# Patient Record
Sex: Female | Born: 1965 | Race: White | Hispanic: No | Marital: Married | Smoking: Never smoker
Health system: Southern US, Community
[De-identification: ages and names within clinical notes are randomized; demographics above are authoritative.]

## PROBLEM LIST (undated history)

## (undated) DIAGNOSIS — I429 Cardiomyopathy, unspecified: Secondary | ICD-10-CM

## (undated) DIAGNOSIS — F1910 Other psychoactive substance abuse, uncomplicated: Secondary | ICD-10-CM

## (undated) DIAGNOSIS — N289 Disorder of kidney and ureter, unspecified: Secondary | ICD-10-CM

## (undated) DIAGNOSIS — I509 Heart failure, unspecified: Secondary | ICD-10-CM

## (undated) DIAGNOSIS — F419 Anxiety disorder, unspecified: Secondary | ICD-10-CM

## (undated) DIAGNOSIS — I10 Essential (primary) hypertension: Secondary | ICD-10-CM

---

## 2008-03-29 ENCOUNTER — Ambulatory Visit: Payer: Self-pay | Admitting: Family Medicine

## 2011-05-29 DIAGNOSIS — I5032 Chronic diastolic (congestive) heart failure: Secondary | ICD-10-CM | POA: Insufficient documentation

## 2011-05-29 DIAGNOSIS — Z889 Allergy status to unspecified drugs, medicaments and biological substances status: Secondary | ICD-10-CM | POA: Insufficient documentation

## 2011-05-29 DIAGNOSIS — I10 Essential (primary) hypertension: Secondary | ICD-10-CM | POA: Insufficient documentation

## 2011-07-30 ENCOUNTER — Ambulatory Visit: Payer: Self-pay | Admitting: Family Medicine

## 2012-01-17 ENCOUNTER — Ambulatory Visit: Payer: Self-pay | Admitting: Family Medicine

## 2012-01-18 ENCOUNTER — Emergency Department: Payer: Self-pay | Admitting: Emergency Medicine

## 2012-02-16 ENCOUNTER — Ambulatory Visit: Payer: Self-pay | Admitting: Internal Medicine

## 2013-10-12 ENCOUNTER — Ambulatory Visit: Payer: Self-pay | Admitting: Physician Assistant

## 2013-10-12 LAB — BASIC METABOLIC PANEL
ANION GAP: 12 (ref 7–16)
BUN: 13 mg/dL (ref 7–18)
CHLORIDE: 100 mmol/L (ref 98–107)
CO2: 24 mmol/L (ref 21–32)
Calcium, Total: 8.9 mg/dL (ref 8.5–10.1)
Creatinine: 1.22 mg/dL (ref 0.60–1.30)
EGFR (Non-African Amer.): 52 — ABNORMAL LOW
Glucose: 91 mg/dL (ref 65–99)
Osmolality: 272 (ref 275–301)
Potassium: 3.3 mmol/L — ABNORMAL LOW (ref 3.5–5.1)
Sodium: 136 mmol/L (ref 136–145)

## 2013-10-15 LAB — RAPID INFLUENZA A&B ANTIGENS

## 2015-12-02 ENCOUNTER — Emergency Department: Payer: Medicare Other

## 2015-12-02 ENCOUNTER — Emergency Department
Admission: EM | Admit: 2015-12-02 | Discharge: 2015-12-02 | Disposition: A | Discharge status: Home / Self Care | Payer: Medicare Other | Attending: Emergency Medicine | Admitting: Emergency Medicine

## 2015-12-02 ENCOUNTER — Encounter: Payer: Self-pay | Admitting: Emergency Medicine

## 2015-12-02 DIAGNOSIS — Z79899 Other long term (current) drug therapy: Secondary | ICD-10-CM | POA: Insufficient documentation

## 2015-12-02 DIAGNOSIS — R05 Cough: Secondary | ICD-10-CM | POA: Diagnosis present

## 2015-12-02 DIAGNOSIS — J209 Acute bronchitis, unspecified: Secondary | ICD-10-CM | POA: Diagnosis not present

## 2015-12-02 DIAGNOSIS — Z7951 Long term (current) use of inhaled steroids: Secondary | ICD-10-CM | POA: Insufficient documentation

## 2015-12-02 DIAGNOSIS — J40 Bronchitis, not specified as acute or chronic: Secondary | ICD-10-CM

## 2015-12-02 HISTORY — DX: Heart failure, unspecified: I50.9

## 2015-12-02 HISTORY — DX: Disorder of kidney and ureter, unspecified: N28.9

## 2015-12-02 MED ORDER — PREDNISONE 10 MG PO TABS: 10.0000 mg | ORAL_TABLET | ORAL | Status: DC

## 2015-12-02 MED ORDER — AZITHROMYCIN 250 MG PO TABS: ORAL_TABLET | ORAL | Status: DC

## 2015-12-02 MED ORDER — ALBUTEROL SULFATE (2.5 MG/3ML) 0.083% IN NEBU: 2.5000 mg | INHALATION_SOLUTION | Freq: Four times a day (QID) | RESPIRATORY_TRACT | Status: DC | PRN

## 2015-12-02 MED ORDER — PROMETHAZINE HCL 12.5 MG PO TABS: 12.5000 mg | ORAL_TABLET | Freq: Four times a day (QID) | ORAL | Status: DC | PRN

## 2015-12-02 NOTE — Discharge Instructions (Signed)

## 2015-12-02 NOTE — ED Notes (Signed)
Cough and fever x 2 days.  

## 2015-12-02 NOTE — ED Provider Notes (Signed)
[facility] Emergency Department Provider Note  ____________________________________________  Time seen: Approximately 8:53 AM  I have reviewed the triage vital signs and the nursing notes.   HISTORY  Chief Complaint Cough    HPI Katie [name] is a 50 y.o. female who presents emergency department complaining of cough and fever 2 days. Patient states that she has a history of bronchitis and pneumonia sporadically. She states that she used her nebulizer machine with mild relief yesterday but states that she has her wheezing and "rattling" while breathing. Patient denies any difficulty breathing at this time. She denies any chest pain. Patient denies any nasal congestion, sore throat, abdominal pain, nausea or vomiting, diarrhea or constipation.   Past Medical History  Diagnosis Date  . CHF (congestive heart failure) (HCC)   . Renal insufficiency     There are no active problems to display for this patient.   History reviewed. No pertinent past surgical history.  Current Outpatient Rx  Name  Route  Sig  Dispense  Refill  . albuterol (PROVENTIL HFA;VENTOLIN HFA) 108 (90 Base) MCG/ACT inhaler   Inhalation   Inhale 2 puffs into the lungs every 6 (six) hours as needed for wheezing or shortness of breath.         . carvedilol (COREG) 12.5 MG tablet   Oral   Take 12.5 mg by mouth 2 (two) times daily with a meal.         . Cholecalciferol 1000 units tablet   Oral   Take 1,000 Units by mouth daily.         . clorazepate (TRANXENE) 15 MG tablet   Oral   Take 15 mg by mouth 2 (two) times daily.         . fluticasone (FLONASE) 50 MCG/ACT nasal spray   Each Nare   Place 2 sprays into both nostrils daily.         . fluticasone (VERAMYST) 27.5 MCG/SPRAY nasal spray   Nasal   Place 2 sprays into the nose daily.         . furosemide (LASIX) 10 MG/ML solution   Oral   Take 80 mg by mouth daily. Take 2 tabs in am and 1 tab in the  afternoon         . gabapentin (NEURONTIN) 300 MG capsule   Oral   Take 300 mg by mouth 2 (two) times daily.         Marland Kitchen omeprazole (PRILOSEC) 20 MG capsule   Oral   Take 20 mg by mouth daily.         . potassium chloride SA (K-DUR,KLOR-CON) 20 MEQ tablet   Oral   Take 20 mEq by mouth 2 (two) times daily.         . prazosin (MINIPRESS) 2 MG capsule   Oral   Take 2 mg by mouth at bedtime.         . ramipril (ALTACE) 10 MG capsule   Oral   Take 10 mg by mouth daily.         . sertraline (ZOLOFT) 100 MG tablet   Oral   Take 100 mg by mouth daily.         Marland Kitchen zolpidem (AMBIEN) 10 MG tablet   Oral   Take 10 mg by mouth at bedtime as needed for sleep.         Marland Kitchen albuterol (PROVENTIL) (2.5 MG/3ML) 0.083% nebulizer solution   Nebulization   Take 3 mLs (  2.5 mg total) by nebulization every 6 (six) hours as needed for wheezing or shortness of breath.   50 vial   0   . azithromycin (ZITHROMAX Z-PAK) 250 MG tablet      Take 2 tablets (500 mg) on  Day 1,  followed by 1 tablet (250 mg) once daily on Days 2 through 5.   6 each   0   . predniSONE (DELTASONE) 10 MG tablet   Oral   Take 1 tablet (10 mg total) by mouth as directed.   21 tablet   0     Take on a daily basis of 6, 5, 4, 3, 2, 1   . promethazine (PHENERGAN) 12.5 MG tablet   Oral   Take 1 tablet (12.5 mg total) by mouth every 6 (six) hours as needed for nausea or vomiting.   15 tablet   0   . spironolactone (ALDACTONE) 25 MG tablet   Oral   Take 25 mg by mouth daily.           Allergies Ciprofloxacin  No family history on file.  Social History Social History  Substance Use Topics  . Smoking status: Never Smoker   . Smokeless tobacco: None  . Alcohol Use: No     Review of Systems  Constitutional: Positive fever/chills Eyes: No visual changes. No discharge ENT: No sore throat. No nasal congestion. No ear pain. Cardiovascular: no chest pain. Respiratory: Positive cough. No  SOB. Gastrointestinal: No abdominal pain.  No nausea, no vomiting.  No diarrhea.  No constipation. Skin: Negative for rash. Neurological: Negative for headaches, focal weakness or numbness. 10-point ROS otherwise negative.  ____________________________________________   PHYSICAL EXAM:  VITAL SIGNS: ED Triage Vitals  Enc Vitals Group     BP 12/02/15 0810 127/73 mmHg     Pulse Rate 12/02/15 0810 104     Resp 12/02/15 0810 20     Temp 12/02/15 0810 98.4 F (36.9 C)     Temp Source 12/02/15 0810 Oral     SpO2 12/02/15 0810 95 %     Weight 12/02/15 0810 180 lb (81.647 kg)     Height 12/02/15 0810 5\' 2"  (1.575 m)     Head Cir --      Peak Flow --      Pain Score 12/02/15 0811 10     Pain Loc --      Pain Edu? --      Excl. in GC? --      Constitutional: Alert and oriented. Well appearing and in no acute distress. Eyes: Conjunctivae are normal. PERRL. EOMI. Head: Atraumatic. ENT:      Ears: ACs and TMs are unremarkable bilaterally.      Nose: No congestion/rhinnorhea.      Mouth/Throat: Mucous membranes are moist. Oropharynx is nonerythematous and nonedematous. Neck: No stridor.  Full range of motion to neck. Neck is supple. Hematological/Lymphatic/Immunilogical: No cervical lymphadenopathy. Cardiovascular: Normal rate, regular rhythm. Normal S1 and S2.  Good peripheral circulation. Respiratory: Normal respiratory effort without tachypnea or retractions. Lungs with rhonchi and wheezing to right lower lobe. Good air entry into the bases bilaterally. No absent or decreased breath sounds. Gastrointestinal: Soft and nontender. No distention. No CVA tenderness. Musculoskeletal: No lower extremity tenderness nor edema.  No joint effusions. Neurologic:  Normal speech and language. No gross focal neurologic deficits are appreciated.  Skin:  Skin is warm, dry and intact. No rash noted. Psychiatric: Mood and affect are normal. Speech and behavior are  normal. Patient exhibits appropriate  insight and judgement.   ____________________________________________   LABS (all labs ordered are listed, but only abnormal results are displayed)  Labs Reviewed - No data to display ____________________________________________  EKG   ____________________________________________  RADIOLOGY Festus Barren Cuthriell, personally viewed and evaluated these images (plain radiographs) as part of my medical decision making, as well as reviewing the written report by the radiologist.  Dg Chest 2 View  12/02/2015  CLINICAL DATA:  Chest pain with chills and cough for 1 day EXAM: CHEST  2 VIEW COMPARISON:  October 12, 2013 FINDINGS: There is mild left base atelectasis. There is no edema or consolidation. The heart size and pulmonary vascularity are normal. No adenopathy. No bone lesions. IMPRESSION: Mild left base atelectasis.  No edema or consolidation. Electronically Signed   By: Bretta Bang III M.D.   On: 12/02/2015 09:19    ____________________________________________    PROCEDURES  Procedure(s) performed:       Medications - No data to display   ____________________________________________   INITIAL IMPRESSION / ASSESSMENT AND PLAN / ED COURSE  Pertinent labs & imaging results that were available during my care of the patient were reviewed by me and considered in my medical decision making (see chart for details).  Patient's diagnosis is consistent with bronchitis. Patient will be discharged home with prescriptions for azithromycin, steroids, refill of her nebulized albuterol, and Phenergan. Patient is to follow up with primary care provider if symptoms persist past this treatment course. Patient is given ED precautions to return to the ED for any worsening or new symptoms.     ____________________________________________  FINAL CLINICAL IMPRESSION(S) / ED DIAGNOSES  Final diagnoses:  Bronchitis      NEW MEDICATIONS STARTED DURING THIS VISIT:  New  Prescriptions   ALBUTEROL (PROVENTIL) (2.5 MG/3ML) 0.083% NEBULIZER SOLUTION    Take 3 mLs (2.5 mg total) by nebulization every 6 (six) hours as needed for wheezing or shortness of breath.   AZITHROMYCIN (ZITHROMAX Z-PAK) 250 MG TABLET    Take 2 tablets (500 mg) on  Day 1,  followed by 1 tablet (250 mg) once daily on Days 2 through 5.   PREDNISONE (DELTASONE) 10 MG TABLET    Take 1 tablet (10 mg total) by mouth as directed.   PROMETHAZINE (PHENERGAN) 12.5 MG TABLET    Take 1 tablet (12.5 mg total) by mouth every 6 (six) hours as needed for nausea or vomiting.        Delorise Royals Cuthriell, PA-C 12/02/15 7829  Sharman Cheek, MD 12/02/15 [phone]

## 2016-04-02 ENCOUNTER — Ambulatory Visit
Admission: EM | Admit: 2016-04-02 | Discharge: 2016-04-02 | Disposition: A | Discharge status: Home / Self Care | Payer: Medicare Other | Attending: Family Medicine | Admitting: Family Medicine

## 2016-04-02 ENCOUNTER — Encounter: Payer: Self-pay | Admitting: Emergency Medicine

## 2016-04-02 DIAGNOSIS — S0502XA Injury of conjunctiva and corneal abrasion without foreign body, left eye, initial encounter: Secondary | ICD-10-CM

## 2016-04-02 HISTORY — DX: Essential (primary) hypertension: I10

## 2016-04-02 MED ORDER — HYDROCODONE-ACETAMINOPHEN 5-325 MG PO TABS: 1.0000 | ORAL_TABLET | Freq: Three times a day (TID) | ORAL | Status: DC | PRN

## 2016-04-02 MED ORDER — GENTAMICIN SULFATE 0.3 % OP SOLN: 2.0000 [drp] | Freq: Three times a day (TID) | OPHTHALMIC | Status: DC

## 2016-04-02 NOTE — ED Provider Notes (Signed)
CSN: [id number]     Arrival date & time 04/02/16  1246 History   First MD Initiated Contact with Patient 04/02/16 1421     Nurses notes were reviewed. Chief Complaint  Patient presents with  . Eye Drainage   Patient's here because of foreign object sensation in the left eye. She states this started this morning he feels that something is in her left eye. She has 2 dogs is wondering whether one the dorsum of the hairs from the dogs got into her left eye. She states the pain as severe as 10 out of 10. She comes in wearing sunglasses\   Past history marked medical history she has a history of CHF renal insufficiency and hypertension. She states she has cardiomyopathy and asthma she got the CHF. Father also and family has had history of CHF and cardiomyopathy as well. She does not smoke now. She is allergic to ciprofloxacin.     (Consider location/radiation/quality/duration/timing/severity/associated sxs/prior Treatment) Patient is a 50 y.o. female presenting with eye problem. The history is provided by the patient and a relative. No language interpreter was used.  Eye Problem Location:  L eye Quality:  Aching, foreign body sensation and sharp Severity:  Severe Onset quality:  Sudden Duration:  6 hours Timing:  Constant Progression:  Worsening Chronicity:  New Context: foreign body and scratch   Context: not direct trauma   Foreign body:  Unknown Relieved by:  Nothing Worsened by:  Nothing tried Ineffective treatments:  None tried Associated symptoms: discharge, itching and redness     Past Medical History  Diagnosis Date  . CHF (congestive heart failure) (HCC)   . Renal insufficiency   . Hypertension    History reviewed. No pertinent past surgical history. History reviewed. No pertinent family history. Social History  Substance Use Topics  . Smoking status: Never Smoker   . Smokeless tobacco: None  . Alcohol Use: No   OB History    No data available     Review of  Systems  Eyes: Positive for pain, discharge, redness and itching.  All other systems reviewed and are negative.   Allergies  Ciprofloxacin  Home Medications   Prior to Admission medications   Medication Sig Start Date End Date Taking? Authorizing Provider  rosuvastatin (CRESTOR) 10 MG tablet Take 10 mg by mouth daily.   Yes Historical Provider, MD  albuterol (PROVENTIL HFA;VENTOLIN HFA) 108 (90 Base) MCG/ACT inhaler Inhale 2 puffs into the lungs every 6 (six) hours as needed for wheezing or shortness of breath.    Historical Provider, MD  albuterol (PROVENTIL) (2.5 MG/3ML) 0.083% nebulizer solution Take 3 mLs (2.5 mg total) by nebulization every 6 (six) hours as needed for wheezing or shortness of breath. 12/02/15   Delorise Royals Cuthriell, PA-C  azithromycin (ZITHROMAX Z-PAK) 250 MG tablet Take 2 tablets (500 mg) on  Day 1,  followed by 1 tablet (250 mg) once daily on Days 2 through 5. 12/02/15   Christiane Ha D Cuthriell, PA-C  carvedilol (COREG) 12.5 MG tablet Take 12.5 mg by mouth 2 (two) times daily with a meal.    Historical Provider, MD  Cholecalciferol 1000 units tablet Take 1,000 Units by mouth daily.    Historical Provider, MD  clorazepate (TRANXENE) 15 MG tablet Take 15 mg by mouth 2 (two) times daily.    Historical Provider, MD  fluticasone (FLONASE) 50 MCG/ACT nasal spray Place 2 sprays into both nostrils daily.    Historical Provider, MD  fluticasone (VERAMYST) 27.5 MCG/SPRAY nasal  spray Place 2 sprays into the nose daily.    Historical Provider, MD  furosemide (LASIX) 10 MG/ML solution Take 80 mg by mouth daily. Take 2 tabs in am and 1 tab in the afternoon    Historical Provider, MD  gabapentin (NEURONTIN) 300 MG capsule Take 300 mg by mouth 2 (two) times daily.    Historical Provider, MD  gentamicin (GARAMYCIN) 0.3 % ophthalmic solution Place 2 drops into the left eye 3 (three) times daily. 04/02/16   Hassan Rowan, MD  HYDROcodone-acetaminophen (NORCO) 5-325 MG tablet Take 1 tablet by  mouth every 8 (eight) hours as needed for moderate pain. 04/02/16   Hassan Rowan, MD  omeprazole (PRILOSEC) 20 MG capsule Take 20 mg by mouth daily.    Historical Provider, MD  potassium chloride SA (K-DUR,KLOR-CON) 20 MEQ tablet Take 20 mEq by mouth 2 (two) times daily.    Historical Provider, MD  prazosin (MINIPRESS) 2 MG capsule Take 2 mg by mouth at bedtime.    Historical Provider, MD  predniSONE (DELTASONE) 10 MG tablet Take 1 tablet (10 mg total) by mouth as directed. 12/02/15   Delorise Royals Cuthriell, PA-C  promethazine (PHENERGAN) 12.5 MG tablet Take 1 tablet (12.5 mg total) by mouth every 6 (six) hours as needed for nausea or vomiting. 12/02/15   Delorise Royals Cuthriell, PA-C  ramipril (ALTACE) 10 MG capsule Take 10 mg by mouth daily.    Historical Provider, MD  sertraline (ZOLOFT) 100 MG tablet Take 100 mg by mouth daily.    Historical Provider, MD  spironolactone (ALDACTONE) 25 MG tablet Take 25 mg by mouth daily.    Historical Provider, MD  zolpidem (AMBIEN) 10 MG tablet Take 10 mg by mouth at bedtime as needed for sleep.    Historical Provider, MD   Meds Ordered and Administered this Visit  Medications - No data to display  BP 152/117 mmHg  Pulse 120  Temp(Src) 98.7 F (37.1 C) (Oral)  Resp 16  Ht 5\' 2"  (1.575 m)  Wt 170 lb (77.111 kg)  BMI 31.09 kg/m2  SpO2 100%  LMP 03/30/2016 (Exact Date) No data found.   Physical Exam  Constitutional: She is oriented to person, place, and time. She appears well-developed and well-nourished.  HENT:  Head: Normocephalic.  Eyes: Pupils are equal, round, and reactive to light. Left eye exhibits discharge. Left conjunctiva is injected.  Slit lamp exam:      The left eye shows corneal abrasion and fluorescein uptake.  Neck: Normal range of motion.  Musculoskeletal: Normal range of motion.  Neurological: She is alert and oriented to person, place, and time.  Skin: Skin is warm and dry.  Psychiatric: Her mood appears anxious.  Vitals  reviewed.   ED Course  .Foreign Body Removal Date/Time: 04/02/2016 3:47 PM Performed by: Hassan Rowan Authorized by: Hassan Rowan Consent: Verbal consent obtained. Patient understanding: patient states understanding of the procedure being performed Body area: eye Location details: left cornea Local anesthetic: tetracaine drops Patient sedated: no Patient restrained: no Patient cooperative: yes Localization method: eyelid eversion and visualized Removal mechanism: eyelid eversion and moist cotton swab Eye examined with fluorescein. Corneal abrasion size: small Corneal abrasion location: medial and inferior No residual rust ring present. Depth: superficial Complexity: simple 0 objects recovered. Objects recovered: 0 Post-procedure assessment: foreign body not removed Patient tolerance: Patient tolerated the procedure well with no immediate complications Comments: Patient eyelid was inverted. The area was wiped with a wet Q-tip no foreign bodies recovered. Over the cornea  abrasion Q-tip was passed as well and no recovery of any material of the abrasion over the left cornea   (including critical care time)  Labs Review Labs Reviewed - No data to display  Imaging Review No results found.   Visual Acuity Review  Right Eye Distance: 20/40 uncorrected Left Eye Distance: 20/30 uncorrected Bilateral Distance:    Right Eye Near:   Left Eye Near:    Bilateral Near:         MDM   1. Corneal abrasion, left, initial encounter     Patient was informed that she does have corneal abrasion ago placed on gentamicin eyedrops 2 drops in the left eye 3 times a day. If she is not improving in 24 hours she'll need to see an ophthalmologist for choice. Also give a prescription for Vicodin for pain. Eyelid was retracted and no foreign object covered   Hassan Rowan, MD 04/02/16 1857

## 2016-04-02 NOTE — ED Notes (Signed)
Patient c/o redness and drainage from her left eye that started this morning.

## 2016-04-02 NOTE — Discharge Instructions (Signed)
Corneal Abrasion °The cornea is the clear covering at the front and center of the eye. When you look at the colored portion of the eye, you are looking through the cornea. It is a thin tissue made up of layers. The top layer is the most sensitive layer. A corneal abrasion happens if this layer is scratched or an injury causes it to come off.  °HOME CARE °· You may be given drops or a medicated cream. Use the medicine as told by your doctor. °· A pressure patch may be put over the eye. If this is done, follow your doctor's instructions for when to remove the patch. Do not drive or use machines while the eye patch is on. Judging distances is hard to do with a patch on. °· See your doctor for a follow-up exam if you are told to do so. It is very important that you keep this appointment. °GET HELP IF:  °· You have pain, are sensitive to light, and have a scratchy feeling in one eye or both eyes. °· Your pressure patch keeps getting loose. You can blink your eye under the patch. °· You have fluid coming from your eye or the lids stick together in the morning. °· You have the same symptoms in the morning that you did with the first abrasion. This could be days, weeks, or months after the first abrasion healed. °  °This information is not intended to replace advice given to you by your health care provider. Make sure you discuss any questions you have with your health care provider. °  °Document Released: 03/09/2008 Document Revised: 06/12/2015 Document Reviewed: 05/29/2013 °Elsevier Interactive Patient Education ©2016 [employer] ° °

## 2017-02-27 DIAGNOSIS — F419 Anxiety disorder, unspecified: Secondary | ICD-10-CM | POA: Insufficient documentation

## 2017-02-27 DIAGNOSIS — R55 Syncope and collapse: Secondary | ICD-10-CM | POA: Insufficient documentation

## 2018-07-24 ENCOUNTER — Encounter: Payer: Self-pay | Admitting: Gynecology

## 2018-07-24 ENCOUNTER — Ambulatory Visit
Admission: EM | Admit: 2018-07-24 | Discharge: 2018-07-24 | Disposition: A | Discharge status: Home / Self Care | Payer: Medicare Other | Attending: Emergency Medicine | Admitting: Emergency Medicine

## 2018-07-24 DIAGNOSIS — S29019A Strain of muscle and tendon of unspecified wall of thorax, initial encounter: Secondary | ICD-10-CM | POA: Diagnosis not present

## 2018-07-24 DIAGNOSIS — X500XXA Overexertion from strenuous movement or load, initial encounter: Secondary | ICD-10-CM

## 2018-07-24 DIAGNOSIS — S161XXA Strain of muscle, fascia and tendon at neck level, initial encounter: Secondary | ICD-10-CM | POA: Diagnosis not present

## 2018-07-24 HISTORY — DX: Cardiomyopathy, unspecified: I42.9

## 2018-07-24 HISTORY — DX: Other psychoactive substance abuse, uncomplicated: F19.10

## 2018-07-24 HISTORY — DX: Anxiety disorder, unspecified: F41.9

## 2018-07-24 MED ORDER — MELOXICAM 15 MG PO TABS: 15.0000 mg | ORAL_TABLET | Freq: Every day | ORAL | 0 refills | Status: DC

## 2018-07-24 NOTE — ED Triage Notes (Signed)
Pt was helping her son move on Friday and states she heard something pop when she was throwing away trash. Now having neck pain, shoulder pain and left sided pain that started the next day. Hurts with Range of motions. Has a hx of sciatica and does take gabapentin.

## 2018-07-24 NOTE — Discharge Instructions (Signed)
Use the meloxicam for only 14 days maximum.  Use bio freeze to the areas of pain 3 times daily as necessary for comfort.Apply ice 20 minutes out of every 2 hours 4-5 times daily for comfort.

## 2018-07-24 NOTE — ED Provider Notes (Signed)
[facility]    CSN: [id number] Arrival date & time: 07/24/18  1441     History   Chief Complaint Chief Complaint  Patient presents with  . Back Pain    HPI Katie [name] is a 52 y.o. female.   HPI. 52 year old female with  chronic sided sciatica, history of substance abuse presents with left sided neck pain extending into her thoracic area lumbar and into the anterior thigh.  She states that she was helping her son move on Friday and was moving trash in an overhead throw motion when  she heard something pop.  She likens it to one of the thick beige rubber bands popping..  States that it hurts with range of motion.  She has no numbness or tingling specifically but has had some radicular symptoms into her left upper extremity.        Past Medical History:  Diagnosis Date  . Anxiety   . Cardiomyopathy (HCC)   . CHF (congestive heart failure) (HCC)   . Hypertension   . Renal insufficiency   . Substance abuse (HCC)    alcohol    There are no active problems to display for this patient.   History reviewed. No pertinent surgical history.  OB History   None      Home Medications    Prior to Admission medications   Medication Sig Start Date End Date Taking? Authorizing Provider  albuterol (PROVENTIL HFA;VENTOLIN HFA) 108 (90 Base) MCG/ACT inhaler Inhale 2 puffs into the lungs every 6 (six) hours as needed for wheezing or shortness of breath.   Yes [provider]  albuterol (PROVENTIL) (2.5 MG/3ML) 0.083% nebulizer solution Take 3 mLs (2.5 mg total) by nebulization every 6 (six) hours as needed for wheezing or shortness of breath. 12/02/15  Yes Cuthriell, Delorise Royals, PA-C  azithromycin (ZITHROMAX Z-PAK) 250 MG tablet Take 2 tablets (500 mg) on  Day 1,  followed by 1 tablet (250 mg) once daily on Days 2 through 5. 12/02/15  Yes Cuthriell, Delorise Royals, PA-C  carvedilol (COREG) 12.5 MG tablet Take 12.5 mg by mouth 2 (two) times daily with a meal.    Yes [provider]  Cholecalciferol 1000 units tablet Take 1,000 Units by mouth daily.   Yes [provider]  clorazepate (TRANXENE) 15 MG tablet Take 15 mg by mouth 2 (two) times daily.   Yes [provider]  fluticasone (FLONASE) 50 MCG/ACT nasal spray Place 2 sprays into both nostrils daily.   Yes [provider]  fluticasone (VERAMYST) 27.5 MCG/SPRAY nasal spray Place 2 sprays into the nose daily.   Yes [provider]  furosemide (LASIX) 10 MG/ML solution Take 80 mg by mouth daily. Take 2 tabs in am and 1 tab in the afternoon   Yes [provider]  gabapentin (NEURONTIN) 300 MG capsule Take 300 mg by mouth 2 (two) times daily.   Yes [provider]  gentamicin (GARAMYCIN) 0.3 % ophthalmic solution Place 2 drops into the left eye 3 (three) times daily. 04/02/16  Yes Hassan Rowan, MD  HYDROcodone-acetaminophen (NORCO) 5-325 MG tablet Take 1 tablet by mouth every 8 (eight) hours as needed for moderate pain. 04/02/16  Yes Hassan Rowan, MD  omeprazole (PRILOSEC) 20 MG capsule Take 20 mg by mouth daily.   Yes [provider]  potassium chloride SA (K-DUR,KLOR-CON) 20 MEQ tablet Take 20 mEq by mouth 2 (two) times daily.   Yes [provider]  prazosin (MINIPRESS) 2 MG  capsule Take 2 mg by mouth at bedtime.   Yes [provider]  predniSONE (DELTASONE) 10 MG tablet Take 1 tablet (10 mg total) by mouth as directed. 12/02/15  Yes Cuthriell, Delorise Royals, PA-C  promethazine (PHENERGAN) 12.5 MG tablet Take 1 tablet (12.5 mg total) by mouth every 6 (six) hours as needed for nausea or vomiting. 12/02/15  Yes Cuthriell, Delorise Royals, PA-C  ramipril (ALTACE) 10 MG capsule Take 10 mg by mouth daily.   Yes [provider]  rosuvastatin (CRESTOR) 10 MG tablet Take 10 mg by mouth daily.   Yes [provider]  sertraline (ZOLOFT) 100 MG tablet Take 100 mg by mouth daily.   Yes [provider]    spironolactone (ALDACTONE) 25 MG tablet Take 25 mg by mouth daily.   Yes [provider]  temazepam (RESTORIL) 15 MG capsule Take by mouth.   Yes [provider]  zolpidem (AMBIEN) 10 MG tablet Take 10 mg by mouth at bedtime as needed for sleep.   Yes [provider]  meloxicam (MOBIC) 15 MG tablet Take 1 tablet (15 mg total) by mouth daily. 07/24/18   Lutricia Feil, PA-C    Family History No family history on file.  Social History Social History   Tobacco Use  . Smoking status: Never Smoker  . Smokeless tobacco: Never Used  Substance Use Topics  . Alcohol use: No  . Drug use: Not on file     Allergies   Ciprofloxacin   Review of Systems Review of Systems  Constitutional: Positive for activity change. Negative for appetite change, chills, fatigue and fever.  Musculoskeletal: Positive for myalgias, neck pain and neck stiffness.  All other systems reviewed and are negative.    Physical Exam Triage Vital Signs ED Triage Vitals  Enc Vitals Group     BP 07/24/18 1504 118/74     Pulse Rate 07/24/18 1504 72     Resp 07/24/18 1504 18     Temp 07/24/18 1504 98.6 F (37 C)     Temp Source 07/24/18 1504 Oral     SpO2 07/24/18 1504 95 %     Weight --      Height --      Head Circumference --      Peak Flow --      Pain Score 07/24/18 1506 8     Pain Loc --      Pain Edu? --      Excl. in GC? --    No data found.  Updated Vital Signs BP 118/74 (BP Location: Left Arm)   Pulse 72   Temp 98.6 F (37 C) (Oral)   Resp 18   LMP 07/17/2018 (Exact Date)   SpO2 95%   Visual Acuity Right Eye Distance:   Left Eye Distance:   Bilateral Distance:    Right Eye Near:   Left Eye Near:    Bilateral Near:     Physical Exam  Constitutional: She appears well-developed and well-nourished. No distress.  HENT:  Head: Normocephalic.  Eyes: Pupils are equal, round, and reactive to light. Right eye exhibits no discharge. Left eye exhibits no  discharge.  Neck:  Lamination of the buccal spine shows decreased range of  motion particularly to the left rotation.  Remainder of the exam is normal.  Does have tenderness to palpation of the cervical musculature paraspinal on the left extending into the trapezius.  Tenderness along the medial border of the scapula less so over  the lumbar area.  DTRs are brisk at 3+/4 but symmetrical.  Toe and heel walk normally.  Skin: She is not diaphoretic.  Nursing note and vitals reviewed.    UC Treatments / Results  Labs (all labs ordered are listed, but only abnormal results are displayed) Labs Reviewed - No data to display  EKG None  Radiology No results found.  Procedures Procedures (including critical care time)  Medications Ordered in UC Medications - No data to display  Initial Impression / Assessment and Plan / UC Course  I have reviewed the triage vital signs and the nursing notes.  Pertinent labs & imaging results that were available during my care of the patient were reviewed by me and considered in my medical decision making (see chart for details).     Told her she likely has ligamentous injury to her cervical and thoracic areas mostly.  I will add meloxicam to her gabapentin.  She is currently taking 900 mg 3 times daily.  Additional muscle relaxers would only cause increased CNS depression.  She should apply ice 20 minutes every 2 hours 4-5 times daily and consider applying Biofreeze times daily for comfort.  He is not improving she should follow-up with her primary care physician. Final Clinical Impressions(s) / UC Diagnoses   Final diagnoses:  Cervical strain, acute, initial encounter  Thoracic myofascial strain, initial encounter     Discharge Instructions     Use the meloxicam for only 14 days maximum.  Use bio freeze to the areas of pain 3 times daily as necessary for comfort.Apply ice 20 minutes out of every 2 hours 4-5 times daily for comfort.    ED  Prescriptions    Medication Sig Dispense Auth. Provider   meloxicam (MOBIC) 15 MG tablet Take 1 tablet (15 mg total) by mouth daily. 30 tablet Lutricia Feil, PA-C     Controlled Substance Prescriptions [place] Controlled Substance Registry consulted? Not Applicable   Lutricia Feil, PA-C 07/24/18 1702

## 2020-11-28 DIAGNOSIS — N921 Excessive and frequent menstruation with irregular cycle: Secondary | ICD-10-CM | POA: Insufficient documentation

## 2021-02-25 DIAGNOSIS — G2581 Restless legs syndrome: Secondary | ICD-10-CM | POA: Insufficient documentation

## 2021-06-01 ENCOUNTER — Emergency Department: Payer: Medicare Other

## 2021-06-01 ENCOUNTER — Other Ambulatory Visit: Payer: Self-pay

## 2021-06-01 ENCOUNTER — Inpatient Hospital Stay
Admission: EM | Admit: 2021-06-01 | Discharge: 2021-06-06 | DRG: 917 | Disposition: A | Discharge status: Other Acute Inpatient Hospital | Payer: Medicare Other | Attending: Internal Medicine | Admitting: Internal Medicine

## 2021-06-01 DIAGNOSIS — E785 Hyperlipidemia, unspecified: Secondary | ICD-10-CM | POA: Diagnosis present

## 2021-06-01 DIAGNOSIS — G4089 Other seizures: Secondary | ICD-10-CM | POA: Diagnosis present

## 2021-06-01 DIAGNOSIS — Z7952 Long term (current) use of systemic steroids: Secondary | ICD-10-CM

## 2021-06-01 DIAGNOSIS — G928 Other toxic encephalopathy: Secondary | ICD-10-CM | POA: Diagnosis present

## 2021-06-01 DIAGNOSIS — T383X2A Poisoning by insulin and oral hypoglycemic [antidiabetic] drugs, intentional self-harm, initial encounter: Principal | ICD-10-CM | POA: Diagnosis present

## 2021-06-01 DIAGNOSIS — I110 Hypertensive heart disease with heart failure: Secondary | ICD-10-CM | POA: Diagnosis present

## 2021-06-01 DIAGNOSIS — I5032 Chronic diastolic (congestive) heart failure: Secondary | ICD-10-CM | POA: Diagnosis present

## 2021-06-01 DIAGNOSIS — G4700 Insomnia, unspecified: Secondary | ICD-10-CM | POA: Diagnosis present

## 2021-06-01 DIAGNOSIS — Z20822 Contact with and (suspected) exposure to covid-19: Secondary | ICD-10-CM | POA: Diagnosis present

## 2021-06-01 DIAGNOSIS — I2510 Atherosclerotic heart disease of native coronary artery without angina pectoris: Secondary | ICD-10-CM | POA: Diagnosis present

## 2021-06-01 DIAGNOSIS — F4312 Post-traumatic stress disorder, chronic: Secondary | ICD-10-CM | POA: Diagnosis present

## 2021-06-01 DIAGNOSIS — T1491XA Suicide attempt, initial encounter: Secondary | ICD-10-CM | POA: Diagnosis not present

## 2021-06-01 DIAGNOSIS — I493 Ventricular premature depolarization: Secondary | ICD-10-CM | POA: Diagnosis present

## 2021-06-01 DIAGNOSIS — T383X4A Poisoning by insulin and oral hypoglycemic [antidiabetic] drugs, undetermined, initial encounter: Secondary | ICD-10-CM | POA: Diagnosis not present

## 2021-06-01 DIAGNOSIS — E876 Hypokalemia: Secondary | ICD-10-CM | POA: Diagnosis present

## 2021-06-01 DIAGNOSIS — E160 Drug-induced hypoglycemia without coma: Secondary | ICD-10-CM | POA: Diagnosis present

## 2021-06-01 DIAGNOSIS — F332 Major depressive disorder, recurrent severe without psychotic features: Secondary | ICD-10-CM | POA: Diagnosis present

## 2021-06-01 DIAGNOSIS — R4182 Altered mental status, unspecified: Secondary | ICD-10-CM

## 2021-06-01 DIAGNOSIS — T383X5A Adverse effect of insulin and oral hypoglycemic [antidiabetic] drugs, initial encounter: Secondary | ICD-10-CM | POA: Diagnosis not present

## 2021-06-01 DIAGNOSIS — T383X1A Poisoning by insulin and oral hypoglycemic [antidiabetic] drugs, accidental (unintentional), initial encounter: Secondary | ICD-10-CM | POA: Diagnosis not present

## 2021-06-01 DIAGNOSIS — I428 Other cardiomyopathies: Secondary | ICD-10-CM | POA: Diagnosis present

## 2021-06-01 DIAGNOSIS — E162 Hypoglycemia, unspecified: Secondary | ICD-10-CM

## 2021-06-01 DIAGNOSIS — Z79899 Other long term (current) drug therapy: Secondary | ICD-10-CM

## 2021-06-01 LAB — BLOOD GAS, VENOUS
Acid-Base Excess: 0 mmol/L (ref 0.0–2.0)
Bicarbonate: 24.8 mmol/L (ref 20.0–28.0)
O2 Saturation: 95.9 %
Patient temperature: 37
pCO2, Ven: 40 mmHg — ABNORMAL LOW (ref 44.0–60.0)
pH, Ven: 7.4 (ref 7.250–7.430)
pO2, Ven: 81 mmHg — ABNORMAL HIGH (ref 32.0–45.0)

## 2021-06-01 LAB — URINALYSIS, ROUTINE W REFLEX MICROSCOPIC
Bilirubin Urine: NEGATIVE
Glucose, UA: 50 mg/dL — AB
Ketones, ur: NEGATIVE mg/dL
Nitrite: NEGATIVE
Protein, ur: 100 mg/dL — AB
Specific Gravity, Urine: 1.009 (ref 1.005–1.030)
pH: 7 (ref 5.0–8.0)

## 2021-06-01 LAB — COMPREHENSIVE METABOLIC PANEL
ALT: 13 U/L (ref 0–44)
AST: 22 U/L (ref 15–41)
Albumin: 4 g/dL (ref 3.5–5.0)
Alkaline Phosphatase: 56 U/L (ref 38–126)
Anion gap: 10 (ref 5–15)
BUN: 16 mg/dL (ref 6–20)
CO2: 26 mmol/L (ref 22–32)
Calcium: 9.3 mg/dL (ref 8.9–10.3)
Chloride: 99 mmol/L (ref 98–111)
Creatinine, Ser: 0.75 mg/dL (ref 0.44–1.00)
GFR, Estimated: >60 mL/min (ref >60)
Glucose, Bld: 129 mg/dL — ABNORMAL HIGH (ref 70–99)
Potassium: 2.6 mmol/L — CL (ref 3.5–5.1)
Sodium: 135 mmol/L (ref 135–145)
Total Bilirubin: 0.6 mg/dL (ref 0.3–1.2)
Total Protein: 8 g/dL (ref 6.5–8.1)

## 2021-06-01 LAB — GLUCOSE, CAPILLARY
Glucose-Capillary: 29 mg/dL — CL (ref 70–99)
Glucose-Capillary: 100 mg/dL — ABNORMAL HIGH (ref 70–99)
Glucose-Capillary: 31 mg/dL — CL (ref 70–99)
Glucose-Capillary: 88 mg/dL (ref 70–99)
Glucose-Capillary: 137 mg/dL — ABNORMAL HIGH (ref 70–99)
Glucose-Capillary: 27 mg/dL — CL (ref 70–99)
Glucose-Capillary: 130 mg/dL — ABNORMAL HIGH (ref 70–99)
Glucose-Capillary: 58 mg/dL — ABNORMAL LOW (ref 70–99)
Glucose-Capillary: 50 mg/dL — ABNORMAL LOW (ref 70–99)
Glucose-Capillary: 97 mg/dL (ref 70–99)
Glucose-Capillary: 142 mg/dL — ABNORMAL HIGH (ref 70–99)
Glucose-Capillary: 44 mg/dL — CL (ref 70–99)
Glucose-Capillary: 41 mg/dL — CL (ref 70–99)
Glucose-Capillary: 151 mg/dL — ABNORMAL HIGH (ref 70–99)
Glucose-Capillary: 153 mg/dL — ABNORMAL HIGH (ref 70–99)
Glucose-Capillary: 52 mg/dL — ABNORMAL LOW (ref 70–99)
Glucose-Capillary: 131 mg/dL — ABNORMAL HIGH (ref 70–99)
Glucose-Capillary: 53 mg/dL — ABNORMAL LOW (ref 70–99)
Glucose-Capillary: 66 mg/dL — ABNORMAL LOW (ref 70–99)
Glucose-Capillary: 132 mg/dL — ABNORMAL HIGH (ref 70–99)
Glucose-Capillary: 107 mg/dL — ABNORMAL HIGH (ref 70–99)
Glucose-Capillary: 66 mg/dL — ABNORMAL LOW (ref 70–99)

## 2021-06-01 LAB — URINE DRUG SCREEN, QUALITATIVE (ARMC ONLY)
Amphetamines, Ur Screen: NOT DETECTED
Barbiturates, Ur Screen: NOT DETECTED
Benzodiazepine, Ur Scrn: POSITIVE — AB
Cannabinoid 50 Ng, Ur ~~LOC~~: POSITIVE — AB
Cocaine Metabolite,Ur ~~LOC~~: POSITIVE — AB
MDMA (Ecstasy)Ur Screen: NOT DETECTED
Methadone Scn, Ur: NOT DETECTED
Opiate, Ur Screen: NOT DETECTED
Phencyclidine (PCP) Ur S: NOT DETECTED
Tricyclic, Ur Screen: NOT DETECTED

## 2021-06-01 LAB — POTASSIUM
Potassium: 2.8 mmol/L — ABNORMAL LOW (ref 3.5–5.1)
Potassium: 3 mmol/L — ABNORMAL LOW (ref 3.5–5.1)

## 2021-06-01 LAB — BASIC METABOLIC PANEL
Anion gap: 6 (ref 5–15)
BUN: 9 mg/dL (ref 6–20)
CO2: 24 mmol/L (ref 22–32)
Calcium: 8.3 mg/dL — ABNORMAL LOW (ref 8.9–10.3)
Chloride: 107 mmol/L (ref 98–111)
Creatinine, Ser: 0.81 mg/dL (ref 0.44–1.00)
GFR, Estimated: >60 mL/min (ref >60)
Glucose, Bld: 267 mg/dL — ABNORMAL HIGH (ref 70–99)
Potassium: 4 mmol/L (ref 3.5–5.1)
Sodium: 137 mmol/L (ref 135–145)

## 2021-06-01 LAB — CBC WITH DIFFERENTIAL/PLATELET
Abs Immature Granulocytes: 0.09 10*3/uL — ABNORMAL HIGH (ref 0.00–0.07)
Basophils Absolute: 0 10*3/uL (ref 0.0–0.1)
Basophils Relative: 0 %
Eosinophils Absolute: 0 10*3/uL (ref 0.0–0.5)
Eosinophils Relative: 0 %
HCT: 38.3 % (ref 36.0–46.0)
Hemoglobin: 12.4 g/dL (ref 12.0–15.0)
Immature Granulocytes: 1 %
Lymphocytes Relative: 6 %
Lymphs Abs: 1.2 10*3/uL (ref 0.7–4.0)
MCH: 25.3 pg — ABNORMAL LOW (ref 26.0–34.0)
MCHC: 32.4 g/dL (ref 30.0–36.0)
MCV: 78.2 fL — ABNORMAL LOW (ref 80.0–100.0)
Monocytes Absolute: 0.8 10*3/uL (ref 0.1–1.0)
Monocytes Relative: 5 %
Neutro Abs: 15.9 10*3/uL — ABNORMAL HIGH (ref 1.7–7.7)
Neutrophils Relative %: 88 %
Platelets: 310 10*3/uL (ref 150–400)
RBC: 4.9 MIL/uL (ref 3.87–5.11)
RDW: 16.3 % — ABNORMAL HIGH (ref 11.5–15.5)
WBC: 18 10*3/uL — ABNORMAL HIGH (ref 4.0–10.5)
nRBC: 0 % (ref 0.0–0.2)

## 2021-06-01 LAB — LACTIC ACID, PLASMA
Lactic Acid, Venous: 0.9 mmol/L (ref 0.5–1.9)
Lactic Acid, Venous: 1.7 mmol/L (ref 0.5–1.9)

## 2021-06-01 LAB — PHOSPHORUS: Phosphorus: 2.7 mg/dL (ref 2.5–4.6)

## 2021-06-01 LAB — HIV ANTIBODY (ROUTINE TESTING W REFLEX): HIV Screen 4th Generation wRfx: NONREACTIVE

## 2021-06-01 LAB — TSH: TSH: 0.702 u[IU]/mL (ref 0.350–4.500)

## 2021-06-01 LAB — ETHANOL: Alcohol, Ethyl (B): <10 mg/dL (ref <10)

## 2021-06-01 LAB — CBG MONITORING, ED
Glucose-Capillary: 108 mg/dL — ABNORMAL HIGH (ref 70–99)
Glucose-Capillary: 11 mg/dL — CL (ref 70–99)
Glucose-Capillary: 124 mg/dL — ABNORMAL HIGH (ref 70–99)
Glucose-Capillary: 45 mg/dL — ABNORMAL LOW (ref 70–99)

## 2021-06-01 LAB — RESP PANEL BY RT-PCR (FLU A&B, COVID) ARPGX2
Influenza A by PCR: NEGATIVE
Influenza B by PCR: NEGATIVE
SARS Coronavirus 2 by RT PCR: NEGATIVE

## 2021-06-01 LAB — PROTIME-INR
INR: 1.1 (ref 0.8–1.2)
Prothrombin Time: 14.6 seconds (ref 11.4–15.2)

## 2021-06-01 LAB — PROCALCITONIN: Procalcitonin: <0.1 ng/mL

## 2021-06-01 LAB — MRSA NEXT GEN BY PCR, NASAL: MRSA by PCR Next Gen: DETECTED — AB

## 2021-06-01 LAB — T4, FREE: Free T4: 1.22 ng/dL — ABNORMAL HIGH (ref 0.61–1.12)

## 2021-06-01 LAB — APTT: aPTT: 31 seconds (ref 24–36)

## 2021-06-01 LAB — AMMONIA: Ammonia: 14 umol/L (ref 9–35)

## 2021-06-01 LAB — MAGNESIUM
Magnesium: 1.9 mg/dL (ref 1.7–2.4)
Magnesium: 1.6 mg/dL — ABNORMAL LOW (ref 1.7–2.4)
Magnesium: 2.2 mg/dL (ref 1.7–2.4)

## 2021-06-01 LAB — TROPONIN I (HIGH SENSITIVITY): Troponin I (High Sensitivity): 13 ng/L (ref <18)

## 2021-06-01 LAB — BRAIN NATRIURETIC PEPTIDE: B Natriuretic Peptide: 87 pg/mL (ref 0.0–100.0)

## 2021-06-01 MED ORDER — POTASSIUM CHLORIDE 10 MEQ/100ML IV SOLN
10.0000 meq | INTRAVENOUS | Status: AC
  Administered 2021-06-01 (×2): 10 meq via INTRAVENOUS
  Filled 2021-06-01 (×2): qty 100

## 2021-06-01 MED ORDER — MAGNESIUM SULFATE 2 GM/50ML IV SOLN
2.0000 g | Freq: Once | INTRAVENOUS | Status: AC
  Administered 2021-06-01: 2 g via INTRAVENOUS
  Filled 2021-06-01: qty 50

## 2021-06-01 MED ORDER — DEXTROSE 50 % IV SOLN
0.5000 | INTRAVENOUS | Status: DC | PRN
  Administered 2021-06-01 – 2021-06-02 (×4): 25 mL via INTRAVENOUS
  Administered 2021-06-02: 50 mL via INTRAVENOUS
  Administered 2021-06-02 (×2): 25 mL via INTRAVENOUS
  Administered 2021-06-02: 50 mL via INTRAVENOUS
  Administered 2021-06-03: 25 mL via INTRAVENOUS
  Filled 2021-06-01 (×6): qty 50

## 2021-06-01 MED ORDER — DEXTROSE 50 % IV SOLN: 1.0000 | Freq: Once | INTRAVENOUS | Status: AC

## 2021-06-01 MED ORDER — ONDANSETRON HCL 4 MG/2ML IJ SOLN
4.0000 mg | Freq: Four times a day (QID) | INTRAMUSCULAR | Status: DC | PRN
  Administered 2021-06-02: 4 mg via INTRAVENOUS
  Filled 2021-06-01: qty 2

## 2021-06-01 MED ORDER — HYDROCORTISONE NA SUCCINATE PF 100 MG IJ SOLR
50.0000 mg | Freq: Three times a day (TID) | INTRAMUSCULAR | Status: DC
  Administered 2021-06-01 – 2021-06-05 (×12): 50 mg via INTRAVENOUS
  Filled 2021-06-01: qty 2
  Filled 2021-06-01: qty 1
  Filled 2021-06-01 (×11): qty 2
  Filled 2021-06-01: qty 1

## 2021-06-01 MED ORDER — DEXTROSE 50 % IV SOLN
INTRAVENOUS | Status: AC
  Administered 2021-06-01: 25 mL
  Filled 2021-06-01: qty 50

## 2021-06-01 MED ORDER — DEXTROSE 50 % IV SOLN
INTRAVENOUS | Status: AC
  Filled 2021-06-01: qty 50

## 2021-06-01 MED ORDER — CHLORHEXIDINE GLUCONATE CLOTH 2 % EX PADS
6.0000 | MEDICATED_PAD | Freq: Every day | CUTANEOUS | Status: DC
  Administered 2021-06-01 – 2021-06-04 (×4): 6 via TOPICAL

## 2021-06-01 MED ORDER — POTASSIUM CHLORIDE 2 MEQ/ML IV SOLN
INTRAVENOUS | Status: DC
  Filled 2021-06-01 (×4): qty 1000

## 2021-06-01 MED ORDER — DEXTROSE 50 % IV SOLN
25.0000 mL | Freq: Once | INTRAVENOUS | Status: AC
  Administered 2021-06-01: 50 mL via INTRAVENOUS
  Filled 2021-06-01: qty 50

## 2021-06-01 MED ORDER — ACETAMINOPHEN 325 MG PO TABS
650.0000 mg | ORAL_TABLET | ORAL | Status: DC | PRN
  Administered 2021-06-01 – 2021-06-04 (×4): 650 mg via ORAL
  Filled 2021-06-01 (×4): qty 2

## 2021-06-01 MED ORDER — DEXTROSE 50 % IV SOLN
INTRAVENOUS | Status: AC
  Administered 2021-06-01: 50 mL
  Filled 2021-06-01: qty 50

## 2021-06-01 MED ORDER — POTASSIUM CHLORIDE 2 MEQ/ML IV SOLN
INTRAVENOUS | Status: DC
  Filled 2021-06-01 (×12): qty 1000

## 2021-06-01 MED ORDER — DEXTROSE 50 % IV SOLN
1.0000 | Freq: Once | INTRAVENOUS | Status: AC
  Administered 2021-06-01: 50 mL via INTRAVENOUS
  Filled 2021-06-01: qty 50

## 2021-06-01 MED ORDER — DOCUSATE SODIUM 100 MG PO CAPS: 100.0000 mg | ORAL_CAPSULE | Freq: Two times a day (BID) | ORAL | Status: DC | PRN

## 2021-06-01 MED ORDER — LORAZEPAM 2 MG/ML IJ SOLN
INTRAMUSCULAR | Status: AC
  Filled 2021-06-01: qty 1

## 2021-06-01 MED ORDER — DEXTROSE-NACL 10-0.45 % IV SOLN: INTRAVENOUS | Status: DC

## 2021-06-01 MED ORDER — FAMOTIDINE IN NACL 20-0.9 MG/50ML-% IV SOLN
20.0000 mg | Freq: Two times a day (BID) | INTRAVENOUS | Status: DC
Start: 2021-06-01 — End: 2021-06-03
  Administered 2021-06-01 (×2): 20 mg via INTRAVENOUS
  Filled 2021-06-01 (×3): qty 50

## 2021-06-01 MED ORDER — POLYETHYLENE GLYCOL 3350 17 G PO PACK: 17.0000 g | PACK | Freq: Every day | ORAL | Status: DC | PRN

## 2021-06-01 MED ORDER — ENOXAPARIN SODIUM 40 MG/0.4ML IJ SOSY
40.0000 mg | PREFILLED_SYRINGE | INTRAMUSCULAR | Status: DC
  Administered 2021-06-01 – 2021-06-04 (×4): 40 mg via SUBCUTANEOUS
  Filled 2021-06-01 (×6): qty 0.4

## 2021-06-01 MED ORDER — CHLORHEXIDINE GLUCONATE 0.12 % MT SOLN
15.0000 mL | Freq: Two times a day (BID) | OROMUCOSAL | Status: DC
  Administered 2021-06-02 – 2021-06-05 (×7): 15 mL via OROMUCOSAL
  Filled 2021-06-01 (×6): qty 15

## 2021-06-01 MED ORDER — ORAL CARE MOUTH RINSE
15.0000 mL | Freq: Two times a day (BID) | OROMUCOSAL | Status: DC
  Administered 2021-06-01 (×2): 15 mL via OROMUCOSAL

## 2021-06-01 MED ORDER — PANTOPRAZOLE SODIUM 40 MG IV SOLR
40.0000 mg | INTRAVENOUS | Status: DC
  Administered 2021-06-01 – 2021-06-02 (×2): 40 mg via INTRAVENOUS
  Filled 2021-06-01 (×2): qty 40

## 2021-06-01 MED ORDER — ONDANSETRON HCL 4 MG/2ML IJ SOLN
INTRAMUSCULAR | Status: AC
  Administered 2021-06-01: 4 mg
  Filled 2021-06-01: qty 2

## 2021-06-01 MED ORDER — POTASSIUM CHLORIDE CRYS ER 20 MEQ PO TBCR
40.0000 meq | EXTENDED_RELEASE_TABLET | Freq: Once | ORAL | Status: AC
  Administered 2021-06-01: 40 meq via ORAL
  Filled 2021-06-01: qty 2

## 2021-06-01 MED ORDER — DEXTROSE 50 % IV SOLN
1.0000 | INTRAVENOUS | Status: DC | PRN
  Administered 2021-06-01 (×4): 50 mL via INTRAVENOUS
  Filled 2021-06-01 (×3): qty 50

## 2021-06-01 MED ORDER — POTASSIUM CHLORIDE 10 MEQ/100ML IV SOLN: 10.0000 meq | Freq: Once | INTRAVENOUS | Status: DC

## 2021-06-01 MED ORDER — DEXTROSE 50 % IV SOLN: 1.0000 | Freq: Once | INTRAVENOUS | Status: DC

## 2021-06-01 MED ORDER — SODIUM CHLORIDE 0.9 % IV SOLN
50.0000 ug/h | INTRAVENOUS | Status: AC
  Administered 2021-06-01 – 2021-06-02 (×3): 50 ug/h via INTRAVENOUS
  Filled 2021-06-01 (×3): qty 1

## 2021-06-01 MED ORDER — ORAL CARE MOUTH RINSE
15.0000 mL | Freq: Two times a day (BID) | OROMUCOSAL | Status: DC
  Administered 2021-06-04 – 2021-06-05 (×2): 15 mL via OROMUCOSAL

## 2021-06-01 MED ORDER — DEXTROSE 10 % IV SOLN: INTRAVENOUS | Status: DC

## 2021-06-01 NOTE — ED Notes (Signed)
Patient more responsive at this time.

## 2021-06-01 NOTE — ED Provider Notes (Signed)
[facility] Emergency Department Provider Note  ____________________________________________   Event Date/Time   First MD Initiated Contact with Patient 06/01/21 [phone]     (approximate)  I have reviewed the triage vital signs and the nursing notes.   HISTORY  Chief Complaint Altered Mental Status  Level 5 caveat:  history/ROS limited by acute/critical illness  HPI Katie [name] is a 55 y.o. female with medical history as listed below who presents by EMS for evaluation of decreased level of consciousness, hypoglycemia, and vomiting.  Very little information is available.  Paramedics were called out earlier tonight for similar but milder symptoms and she was found to be hypoglycemic.  She was given D10 but refused transfer to the ED.  Apparently she got worse after that and paramedics were called out again.  At this point she was unresponsive even to painful stimuli although she was continued to breathe on her own.  There was evidence of emesis all around her.  She had an initial fingerstick glucose that seems normal but then paramedics rechecked it and it was in the 20s.  They rechecked it again and it was in the low 30s so they provided D50.  Upon arrival to the emergency department the patient is starting to speak and answer simple questions but does so very slowly and is not oriented to anything other than her name.  She remains very confused.  However she denies any pain and denies shortness of breath.  She has no idea what happened earlier.     Past Medical History:  Diagnosis Date   Anxiety    Cardiomyopathy (HCC)    CHF (congestive heart failure) (HCC)    Hypertension    Renal insufficiency    Substance abuse (HCC)    alcohol    There are no problems to display for this patient.   No past surgical history on file.  Prior to Admission medications   Medication Sig Start Date End Date Taking? Authorizing Provider  albuterol (PROVENTIL  HFA;VENTOLIN HFA) 108 (90 Base) MCG/ACT inhaler Inhale 2 puffs into the lungs every 6 (six) hours as needed for wheezing or shortness of breath.    [provider]  albuterol (PROVENTIL) (2.5 MG/3ML) 0.083% nebulizer solution Take 3 mLs (2.5 mg total) by nebulization every 6 (six) hours as needed for wheezing or shortness of breath. 12/02/15   Cuthriell, Delorise Royals, PA-C  azithromycin (ZITHROMAX Z-PAK) 250 MG tablet Take 2 tablets (500 mg) on  Day 1,  followed by 1 tablet (250 mg) once daily on Days 2 through 5. 12/02/15   Cuthriell, Delorise Royals, PA-C  carvedilol (COREG) 12.5 MG tablet Take 12.5 mg by mouth 2 (two) times daily with a meal.    [provider]  Cholecalciferol 1000 units tablet Take 1,000 Units by mouth daily.    [provider]  clorazepate (TRANXENE) 15 MG tablet Take 15 mg by mouth 2 (two) times daily.    [provider]  fluticasone (FLONASE) 50 MCG/ACT nasal spray Place 2 sprays into both nostrils daily.    [provider]  fluticasone (VERAMYST) 27.5 MCG/SPRAY nasal spray Place 2 sprays into the nose daily.    [provider]  furosemide (LASIX) 10 MG/ML solution Take 80 mg by mouth daily. Take 2 tabs in am and 1 tab in the afternoon    [provider]  gabapentin (NEURONTIN) 300 MG capsule Take 300 mg by mouth 2 (two) times daily.    [provider]  gentamicin (GARAMYCIN) 0.3 % ophthalmic solution Place 2 drops into the left eye 3 (three) times daily. 04/02/16   Hassan Rowan, MD  HYDROcodone-acetaminophen (NORCO) 5-325 MG tablet Take 1 tablet by mouth every 8 (eight) hours as needed for moderate pain. 04/02/16   Hassan Rowan, MD  meloxicam (MOBIC) 15 MG tablet Take 1 tablet (15 mg total) by mouth daily. 07/24/18   Lutricia Feil, PA-C  omeprazole (PRILOSEC) 20 MG capsule Take 20 mg by mouth daily.    [provider]  potassium chloride SA (K-DUR,KLOR-CON) 20 MEQ tablet Take 20 mEq by mouth 2 (two)  times daily.    [provider]  prazosin (MINIPRESS) 2 MG capsule Take 2 mg by mouth at bedtime.    [provider]  predniSONE (DELTASONE) 10 MG tablet Take 1 tablet (10 mg total) by mouth as directed. 12/02/15   Cuthriell, Delorise Royals, PA-C  promethazine (PHENERGAN) 12.5 MG tablet Take 1 tablet (12.5 mg total) by mouth every 6 (six) hours as needed for nausea or vomiting. 12/02/15   Cuthriell, Delorise Royals, PA-C  ramipril (ALTACE) 10 MG capsule Take 10 mg by mouth daily.    [provider]  rosuvastatin (CRESTOR) 10 MG tablet Take 10 mg by mouth daily.    [provider]  sertraline (ZOLOFT) 100 MG tablet Take 100 mg by mouth daily.    [provider]  spironolactone (ALDACTONE) 25 MG tablet Take 25 mg by mouth daily.    [provider]  temazepam (RESTORIL) 15 MG capsule Take by mouth.    [provider]  zolpidem (AMBIEN) 10 MG tablet Take 10 mg by mouth at bedtime as needed for sleep.    [provider]    Allergies Ciprofloxacin  No family history on file.  Social History Social History   Tobacco Use   Smoking status: Never   Smokeless tobacco: Never  Vaping Use   Vaping Use: Never used  Substance Use Topics   Alcohol use: No    Review of Systems Level 5 caveat:  history/ROS limited by acute/critical illness  ____________________________________________   PHYSICAL EXAM:  VITAL SIGNS: ED Triage Vitals  Enc Vitals Group     BP 06/01/21 0500 (!) 172/134     Pulse Rate 06/01/21 0500 95     Resp 06/01/21 0500 19     Temp 06/01/21 0500 97.8 F (36.6 C)     Temp Source 06/01/21 0500 Oral     SpO2 06/01/21 0500 97 %     Weight 06/01/21 0454 83.9 kg (185 lb)     Height 06/01/21 0454 1.702 m (5\' 7" )     Head Circumference --      Peak Flow --      Pain Score 06/01/21 0454 0     Pain Loc --      Pain Edu? --      Excl. in GC? --     Constitutional: Alert and oriented only to self. Eyes:  Conjunctivae are normal.  Pupils are equal and reactive. Head: Atraumatic. Nose: No congestion/rhinnorhea. Mouth/Throat: Patient is wearing a mask. Neck: No stridor.  No meningeal signs.   Cardiovascular: Normal rate, regular rhythm. Good peripheral circulation. Respiratory: Normal respiratory effort.  No retractions. Gastrointestinal: Soft and nontender. No distention.  Musculoskeletal: No lower extremity tenderness nor edema. No gross deformities of extremities. Neurologic: Slow speech and language but without dysarthria or any evidence of aphasia. GCS 14 for confusion.  No obvious  focal neurological deficits.  Moving all 4 extremities but has difficulty following commands. Skin:  Skin is warm, dry and intact.   ____________________________________________   LABS (all labs ordered are listed, but only abnormal results are displayed)  Labs Reviewed  COMPREHENSIVE METABOLIC PANEL - Abnormal; Notable for the following components:      Result Value   Potassium 2.6 (*)    Glucose, Bld 129 (*)    All other components within normal limits  CBC WITH DIFFERENTIAL/PLATELET - Abnormal; Notable for the following components:   WBC 18.0 (*)    MCV 78.2 (*)    MCH 25.3 (*)    RDW 16.3 (*)    Neutro Abs 15.9 (*)    Abs Immature Granulocytes 0.09 (*)    All other components within normal limits  BLOOD GAS, VENOUS - Abnormal; Notable for the following components:   Bicarbonate 28.5 (*)    Acid-Base Excess 3.1 (*)    All other components within normal limits  T4, FREE - Abnormal; Notable for the following components:   Free T4 1.22 (*)    All other components within normal limits  CBG MONITORING, ED - Abnormal; Notable for the following components:   Glucose-Capillary 108 (*)    All other components within normal limits  CBG MONITORING, ED - Abnormal; Notable for the following components:   Glucose-Capillary 11 (*)    All other components within normal limits  CBG MONITORING, ED -  Abnormal; Notable for the following components:   Glucose-Capillary 124 (*)    All other components within normal limits  CBG MONITORING, ED - Abnormal; Notable for the following components:   Glucose-Capillary 45 (*)    All other components within normal limits  RESP PANEL BY RT-PCR (FLU A&B, COVID) ARPGX2  CULTURE, BLOOD (SINGLE)  LACTIC ACID, PLASMA  TSH  ETHANOL  AMMONIA  MAGNESIUM  PROTIME-INR  APTT  LACTIC ACID, PLASMA  URINALYSIS, ROUTINE W REFLEX MICROSCOPIC  URINE DRUG SCREEN, QUALITATIVE ([facility] ONLY)  HIV ANTIBODY (ROUTINE TESTING W REFLEX)  CBC  CREATININE, SERUM  POTASSIUM  TROPONIN I (HIGH SENSITIVITY)   ____________________________________________  EKG  ED ECG REPORT I, Loleta Rose, the attending physician, personally viewed and interpreted this ECG.  Date: 06/01/2021 EKG Time: 4:54 AM Rate: 99 Rhythm: Borderline sinus tachycardia QRS Axis: normal Intervals: Multiformed ventricular premature complexes ST/T Wave abnormalities: Non-specific ST segment / T-wave changes, but no clear evidence of acute ischemia. Narrative Interpretation: no definitive evidence of acute ischemia; does not meet STEMI criteria.  ____________________________________________  RADIOLOGY I, Loleta Rose, personally viewed and evaluated these images (plain radiographs) as part of my medical decision making, as well as reviewing the written report by the radiologist.  ED MD interpretation:  No acute abnormalities on head CT.  Questionable pulmonary vascular congestion on chest x-ray.  Official radiology report(s): CT HEAD WO CONTRAST ( )  Result Date: 06/01/2021 CLINICAL DATA:  55 year old female is unresponsive. Unexplained altered mental status. EXAM: CT HEAD WITHOUT CONTRAST TECHNIQUE: Contiguous axial images were obtained from the base of the skull through the vertex without intravenous contrast. COMPARISON:  Head CT 03/29/2008. FINDINGS: Brain: Cerebral volume is stable and  within normal limits. No midline shift, ventriculomegaly, mass effect, evidence of mass lesion, intracranial hemorrhage or evidence of cortically based acute infarction. Gray-white matter differentiation is within normal limits throughout the brain. No edema or encephalomalacia identified. Vascular: Calcified atherosclerosis at the skull base. No suspicious intracranial vascular hyperdensity. Skull: Intact, negative. Sinuses/Orbits: Interval left  side maxillary antrostomy. Visualized paranasal sinuses and mastoids are clear. Other: Visualized orbits and scalp soft tissues are within normal limits. IMPRESSION: Normal noncontrast Head CT. Electronically Signed   By: Odessa Fleming M.D.   On: 06/01/2021 05:38   DG Chest Port 1 View  Result Date: 06/01/2021 CLINICAL DATA:  Sepsis. EXAM: PORTABLE CHEST 1 VIEW COMPARISON:  12/02/2015 FINDINGS: Normal heart size. Stable cardiomediastinal contours. Low lung volumes with pulmonary vascular congestion. Visualized osseous structures are unremarkable. IMPRESSION: 1. Low lung volumes and pulmonary vascular congestion Electronically Signed   By: Signa Kell M.D.   On: 06/01/2021 05:21    ____________________________________________   PROCEDURES   Procedure(s) performed (including Critical Care):  .Critical Care  Date/Time: 06/01/2021 5:08 AM Performed by: Loleta Rose, MD Authorized by: Loleta Rose, MD   Critical care provider statement:    Critical care time (minutes):  75   Critical care time was exclusive of:  Separately billable procedures and treating other patients   Critical care was necessary to treat or prevent imminent or life-threatening deterioration of the following conditions:  CNS failure or compromise and toxidrome   Critical care was time spent personally by me on the following activities:  Development of treatment plan with patient or surrogate, discussions with consultants, evaluation of patient's response to treatment, examination of  patient, obtaining history from patient or surrogate, ordering and performing treatments and interventions, ordering and review of laboratory studies, ordering and review of radiographic studies, pulse oximetry, re-evaluation of patient's condition and review of old charts .1-3 Lead EKG Interpretation  Date/Time: 06/01/2021 5:08 AM Performed by: Loleta Rose, MD Authorized by: Loleta Rose, MD     Interpretation: normal     ECG rate:  93   ECG rate assessment: normal     Rhythm: sinus rhythm     Ectopy: PVCs     Conduction: normal     ____________________________________________   INITIAL IMPRESSION / MDM / ASSESSMENT AND PLAN / ED COURSE  As part of my medical decision making, I reviewed the following data within the electronic MEDICAL RECORD NUMBER History obtained from family, Nursing notes reviewed and incorporated, Labs reviewed , EKG interpreted , Old chart reviewed, Discussed with admitting physician , and Notes from prior ED visits   Differential diagnosis includes, but is not limited to, medication or drug side effect, sepsis, CVA or intracranial hemorrhage, acute electrolyte or metabolic abnormality, elevated ammonia level, alcohol intoxication, PRES.  The patient is on the cardiac monitor to evaluate for evidence of arrhythmia and/or significant heart rate changes.  Very unclear what has happened upon her initial arrival.  Patient has been persistently hypoglycemic without any history or evidence of insulin use or any history of diabetes to suggest the use of oral antiglycemic's.  The patient denies taking any drugs and her mental status has improved after administration of D50 prior to arrival, but apparently EMS also provided supplementary dextrose about 4 hours ago and then she got worse.  Her mental status is not at baseline.  I am ordering a CT scan of the head as well as broad-spectrum laboratory evaluation.  EKG is nonischemic but with frequent PVCs.  Vital signs are stable  other than hypertension.  She is protecting her airway and does not have any evidence of acute neurological deficits but I have asked for a stroke swallow screen by the nurse.     Clinical Course as of 06/01/21 0813  Sun Jun 01, 2021  1610 Initial fingerstick blood glucose  in the ED is approximately 108 [CF]  0551 CT HEAD WO CONTRAST ( ) No acute abnormalities identified on head CT [CF]  0552 DG Chest [facility] I personally reviewed the patient's imaging and agree with the radiologist's interpretation that it is consistent with mild pulmonary vascular congestion but no other acute abnormality [CF]  0552 CBC WITH DIFFERENTIAL(!) Leukocytosis of 18, otherwise unremarkable [CF]  0552 Negative ethanol level [CF]  0636 I went into the room to talk with the husband who is now at bedside.  He said that she had been talking with him.  As I was speaking with him, she became increasingly less responsive and started twitching and having facial fasciculations.  She stopped responding to either one of [place].  She also started to foam at the mouth.  I was concerned that she may be having a seizure and ask for nursing assistance; we set up suction and 2 L of oxygen by nasal cannula and I suctioned her mouth while her nurse checked a fingerstick blood sugar.  The glucose was 11.  I ordered emergent ministration of 1 ampoule of D50 and within seconds her mental status improved, fasciculations stopped, and she was able to talk to me and tell me she felt better.  I removed her upper and lower dentures given concern for future airway issues.  I ordered a D10 half-normal saline infusion at 100 mL/h for persistent and precipitous drops in her glucose level.  Additionally her labs have come back with a potassium of 2.6 which may be secondary to both dietary and vomiting, but the fact that we are having to give supplementary dextrose makes me concerned it would drop more and could lead to cardiac arrhythmias.  She is now able  to tolerate fluids and I have ordered 40 mEq of potassium by mouth in addition to 10 mEq by IV.  The D10 half-normal saline also will have 40 mEq potassium.   [CF]  [phone] As all this was happening, the husband revealed to me that a woman that was staying with the patient just prior to the husband's arrival home told him that the patient reported that she had injected herself with 3 syringes of insulin that belonged to someone else, possibly the patient's father.  There was no indication of why she would do this.  She did not express any suicidal ideation but her husband says she has been under a lot of stress recently and has not had any sleep for several days.  Now that I know she inject herself with insulin, the hypoglycemia and altered mental status makes more sense.  I do not believe she is having a stroke nor that she would benefit from emergent MRI.  She will need psychiatric consultation when she is medically cleared but in the meantime she requires admission for persistent hypoglycemia and altered mental status in the setting of intentional insulin overdose.  I explained this to the husband and he understands and agrees with the plan. [CF]  0730 The husband was able to get a photo of the box from which the patient reportedly took the 3 pens for syringes of insulin.  At the picture he showed is of Lantus 3 mL syringes with a concentration of 100 units of insulin per milliliter.  This means that she may have injected as much is 900 units of long-acting insulin.  Given this massive intentional overdose, I contacted Dr. Belia Heman with the ICU, and he agreed that ICU admission is most appropriate. [  CF]  [phone] (delayed documentation) patient has been admitted to the ICU but a repeat fingerstick that I ordered showed a glucose in the 40s.  I ordered an additional ampule of D50 prior to her transport upstairs to the unit. [CF]    Clinical Course User Index [CF] Loleta RoseForbach, Anisia Leija, MD      ____________________________________________  FINAL CLINICAL IMPRESSION(S) / ED DIAGNOSES  Final diagnoses:  Hypokalemia  Hypoglycemia  Altered mental status, unspecified altered mental status type  Insulin overdose, undetermined intent, initial encounter     MEDICATIONS GIVEN DURING THIS VISIT:  Medications  dextrose 10 % and 0.45 % NaCl 1,000 mL with potassium chloride 40 mEq infusion ( Intravenous New Bag/Given 06/01/21 0733)  potassium chloride 10 mEq in 100 mL IVPB (has no administration in time range)  acetaminophen (TYLENOL) tablet 650 mg (has no administration in time range)  docusate sodium (COLACE) capsule 100 mg (has no administration in time range)  polyethylene glycol (MIRALAX / GLYCOLAX) packet 17 g (has no administration in time range)  ondansetron (ZOFRAN) injection 4 mg (has no administration in time range)  famotidine (PEPCID) IVPB 20 mg premix (has no administration in time range)  enoxaparin (LOVENOX) injection 40 mg (has no administration in time range)  dextrose 50 % solution 50 mL (50 mLs Intravenous Given 06/01/21 0810)  potassium chloride SA (KLOR-CON) CR tablet 40 mEq (40 mEq Oral Given 06/01/21 0641)  ondansetron (ZOFRAN) 4 MG/2ML injection (4 mg  Given 06/01/21 0646)  dextrose 50 % solution 50 mL (50 mLs Intravenous Given 06/01/21 [id number])     ED Discharge Orders     None        Note:  This document was prepared using Dragon voice recognition software and may include unintentional dictation errors.   Loleta RoseForbach, Duey Liller, MD 06/01/21 [phone]

## 2021-06-01 NOTE — ED Notes (Signed)
Dr York Cerise in with patient and family.  Patient with seizure like activity.  CBG 11.  Family had just told ER provided that patient had injected herself with 3 syringes of insulin at some point in the day.

## 2021-06-01 NOTE — ED Notes (Signed)
Patient to CT.

## 2021-06-01 NOTE — Consult Note (Addendum)
PHARMACY CONSULT NOTE - FOLLOW UP  Pharmacy Consult for Electrolyte Monitoring and Replacement   Recent Labs: Potassium (mmol/L)  Date Value  06/01/2021 2.6 (LL)  10/12/2013 3.3 (L)   Magnesium (mg/dL)  Date Value  91/69/4503 1.9   Calcium (mg/dL)  Date Value  88/82/8003 9.3   Calcium, Total (mg/dL)  Date Value  49/17/9150 8.9   Albumin (g/dL)  Date Value  56/97/9480 4.0   Sodium (mmol/L)  Date Value  06/01/2021 135  10/12/2013 136     Assessment: 54 yo female with past medical history of anxiety, CHF, HTN, and EtOH abuse presents to the emergency department with hypoglycemia and hypokalemia.  Pharmacy has been consulted to monitor and replenish electrolytes.  Pt currently ordered KCL IV x 1 and Kcl po x 1  Pt on IVF D10-1/2NS with Kcl @ 166ml/hr   Goal of Therapy:  Electrolytes wnl's  Plan:  Will recheck potassium @ 1400 and adjust electrolyte replenishment as warranted per protocol  Will f/u Mg, Phos, and BMP with am labs  Albina Billet, PharmD, BCPS Clinical Pharmacist 06/01/2021 7:26 AM

## 2021-06-01 NOTE — ED Notes (Signed)
RN to bedside to introduce self to pt. Spouse at bedside.

## 2021-06-01 NOTE — ED Notes (Signed)
Per husband patient was her normal Saturday morning when he went to work and appeared normal when he returned home at 7 pm.  Around 11 pm they got in the hot tub and patient became altered and began vomiting.  Family called EMS and when they arrived patient's CBG was in the 30's.  After EMS left patient was awake alert and back to her base line, patient ate small amount and then went to lay down.  Patient began to vomite again and family called EMS and patient was transported to the ED.  On arrival to ED patient is responsive to verbal stimuli but will not answer questions, will shake head yes and no.

## 2021-06-01 NOTE — ED Triage Notes (Signed)
Patient to [facility] 24 via EMS from home for altered mental status.  EMS reports that patient would respond to painful stimuli.  EMS reports patient cbg in truck was 28, then 38 and received D10 via IV.

## 2021-06-01 NOTE — H&P (Signed)
NAME:  Katie [name], MRN:  [id number], DOB:  09-03-1966, LOS: 0 ADMISSION DATE:  06/01/2021, CONSULTATION DATE:  06/01/2021 REFERRING MD: Loleta Rose MD, CHIEF COMPLAINT: Altered mental status   HPI  55 y.o female with significant PMH as below who presented to the ED via EMS with chief complaints of altered mental status.  Per ED notes,Patient has been persistently hypoglycemic without any history or evidence of insulin use or any history of diabetes to suggest the use of oral antiglycemic's.  The patient denies taking any drugs and her mental status has improved after administration of D50 prior to arrival, but apparently EMS also provided supplementary dextrose about 4 hours ago and then she got worse. Patient's husband revealed to the ED physicians that a woman that was staying with the patient just prior to the husband's arrival home told him that the patient reported that she had injected herself with 3 syringes of lantus insulin (900 untis in total) that belonged to someone else, possibly the patient's father.  There was no indication of why she would do this.  She did not express any suicidal ideation but her husband says she has been under a lot of stress recently and has not had any sleep for several days.  ED Course: On arrival to the ED, she was afebrile with blood pressure 172/134 mm Hg and pulse rate 95 beats/min. There were no focal neurological deficits; she was alert and oriented to self only. EKG with sinus tachycardia and frequent ectopy, and nonspecific ST segment/T wave changes but no clear evidence of acute ischemia.  Labs significant for profound hypokalemia, with K 2.6--recheck pending, s/p 40 mEq of PO Potassium supplement, Mg 1.9, Glucose 129 WBC/Hgb/Hct/Plts:  18.0/12.4/38.3/310 (08/28 0456)  ABG: Venous/Arterial Blood Gas result:  pO2 pending; pCO2 46; pH 7.4;  HCO3 28.5, %O2 Sat 55.1 Lactate: 0.9 UA/UDS: Pending Imaging / Other Labs: Ammonia normal  PCCM was  consulted for admission to the ICU.  Past Medical History  Nonischemic cardiomyopathy Sleep apnea Substance abuse [facility] Hypertension CAD Anxiety  Significant Hospital Events   8/28: Admitted to ICU with suspected intentional drug-induced hypoglycemia  Consults:  Psychiatric  Procedures:  None  Significant Diagnostic Tests:  8/28: Chest Xray>Low lung volumes and pulmonary vascular congestion 8/28: Noncontrast CT head> no acute intracranial abnormality  Micro Data:  8/28: SARS-CoV-2 PCR> negative 8/28: Influenza PCR> negative 8/28: Blood culture x2> 8/28: Urine Culture> 8/28: MRSA PCR>>   Antimicrobials:  None required at this time OBJECTIVE  Blood pressure (!) 145/85, pulse (!) 102, temperature 97.8 F (36.6 C), temperature source Oral, resp. rate (!) 23, height 5\' 7"  (1.702 m), weight 83.9 kg, SpO2 99 %.       No intake or output data in the 24 hours ending 06/01/21 0757 Filed Weights   06/01/21 0454  Weight: 83.9 kg   Physical Examination  GENERAL: 55 year-old critically ill patient lying in the bed with no acute distress.  EYES: Pupils equal, round, reactive to light and accommodation. No scleral icterus. Extraocular muscles intact.  HEENT: Head atraumatic, normocephalic. Oropharynx and nasopharynx clear.  NECK:  Supple, no jugular venous distention. No thyroid enlargement, no tenderness.  LUNGS: Normal breath sounds bilaterally, no wheezing, rales,rhonchi or crepitation. No use of accessory muscles of respiration.  CARDIOVASCULAR: S1, S2 normal. No murmurs, rubs, or gallops.  ABDOMEN: Soft, nontender, nondistended. Bowel sounds present. No organomegaly or mass.  EXTREMITIES: No pedal edema, cyanosis, or clubbing.  NEUROLOGIC: Cranial nerves II through XII  are intact.  Muscle strength 5/5 in all extremities. Sensation intact. Gait not checked.  PSYCHIATRIC: The patient is alert and oriented x 2. Flat affect SKIN: No obvious rash, lesion, or ulcer.   Labs/imaging  that I havepersonally reviewed  (right click and "Reselect all SmartList Selections" daily)     Labs   CBC: Recent Labs  Lab 06/01/21 0456  WBC 18.0*  NEUTROABS 15.9*  HGB 12.4  HCT 38.3  MCV 78.2*  PLT 310    Basic Metabolic Panel: Recent Labs  Lab 06/01/21 0456  NA 135  K 2.6*  CL 99  CO2 26  GLUCOSE 129*  BUN 16  CREATININE 0.75  CALCIUM 9.3  MG 1.9   GFR: Estimated Creatinine Clearance: 88.4 mL/min (by C-G formula based on SCr of 0.75 mg/dL). Recent Labs  Lab 06/01/21 0456  WBC 18.0*  LATICACIDVEN 0.9    Liver Function Tests: Recent Labs  Lab 06/01/21 0456  AST 22  ALT 13  ALKPHOS 56  BILITOT 0.6  PROT 8.0  ALBUMIN 4.0   No results for input(s): LIPASE, AMYLASE in the last 168 hours. Recent Labs  Lab 06/01/21 0539  AMMONIA 14    ABG    Component Value Date/Time   HCO3 28.5 (H) 06/01/2021 0728   O2SAT 55.1 06/01/2021 0728     Coagulation Profile: Recent Labs  Lab 06/01/21 0539  INR 1.1    Cardiac Enzymes: No results for input(s): CKTOTAL, CKMB, CKMBINDEX, TROPONINI in the last 168 hours.  HbA1C: No results found for: HGBA1C  CBG: Recent Labs  Lab 06/01/21 0459 06/01/21 0628 06/01/21 0646  GLUCAP 108* 11* 124*    Review of Systems:   UNABLE TO OBTAIN DUE TO AMS  Past Medical History  She,  has a past medical history of Anxiety, Cardiomyopathy (HCC), CHF (congestive heart failure) (HCC), Hypertension, Renal insufficiency, and Substance abuse (HCC).   Surgical History   No past surgical history on file.   Social History   reports that she has never smoked. She has never used smokeless tobacco. She reports that she does not drink alcohol.   Family History   Her family history is not on file.   Allergies Allergies  Allergen Reactions   Ciprofloxacin Rash     Home Medications  Prior to Admission medications   Medication Sig Start Date End Date Taking? Authorizing Provider  albuterol (PROVENTIL HFA;VENTOLIN  HFA) 108 (90 Base) MCG/ACT inhaler Inhale 2 puffs into the lungs every 6 (six) hours as needed for wheezing or shortness of breath.    [provider]  albuterol (PROVENTIL) (2.5 MG/3ML) 0.083% nebulizer solution Take 3 mLs (2.5 mg total) by nebulization every 6 (six) hours as needed for wheezing or shortness of breath. 12/02/15   Cuthriell, Delorise Royals, PA-C  azithromycin (ZITHROMAX Z-PAK) 250 MG tablet Take 2 tablets (500 mg) on  Day 1,  followed by 1 tablet (250 mg) once daily on Days 2 through 5. 12/02/15   Cuthriell, Delorise Royals, PA-C  carvedilol (COREG) 12.5 MG tablet Take 12.5 mg by mouth 2 (two) times daily with a meal.    [provider]  Cholecalciferol 1000 units tablet Take 1,000 Units by mouth daily.    [provider]  clorazepate (TRANXENE) 15 MG tablet Take 15 mg by mouth 2 (two) times daily.    [provider]  fluticasone (FLONASE) 50 MCG/ACT nasal spray Place 2 sprays into both nostrils daily.    [provider]  fluticasone Madilyn Hook)  27.5 MCG/SPRAY nasal spray Place 2 sprays into the nose daily.    [provider]  furosemide (LASIX) 10 MG/ML solution Take 80 mg by mouth daily. Take 2 tabs in am and 1 tab in the afternoon    [provider]  gabapentin (NEURONTIN) 300 MG capsule Take 300 mg by mouth 2 (two) times daily.    [provider]  gentamicin (GARAMYCIN) 0.3 % ophthalmic solution Place 2 drops into the left eye 3 (three) times daily. 04/02/16   Hassan Rowan, MD  HYDROcodone-acetaminophen (NORCO) 5-325 MG tablet Take 1 tablet by mouth every 8 (eight) hours as needed for moderate pain. 04/02/16   Hassan Rowan, MD  meloxicam (MOBIC) 15 MG tablet Take 1 tablet (15 mg total) by mouth daily. 07/24/18   Lutricia Feil, PA-C  omeprazole (PRILOSEC) 20 MG capsule Take 20 mg by mouth daily.    [provider]  potassium chloride SA (K-DUR,KLOR-CON) 20 MEQ tablet Take 20 mEq by mouth 2 (two) times daily.     [provider]  prazosin (MINIPRESS) 2 MG capsule Take 2 mg by mouth at bedtime.    [provider]  predniSONE (DELTASONE) 10 MG tablet Take 1 tablet (10 mg total) by mouth as directed. 12/02/15   Cuthriell, Delorise Royals, PA-C  promethazine (PHENERGAN) 12.5 MG tablet Take 1 tablet (12.5 mg total) by mouth every 6 (six) hours as needed for nausea or vomiting. 12/02/15   Cuthriell, Delorise Royals, PA-C  ramipril (ALTACE) 10 MG capsule Take 10 mg by mouth daily.    [provider]  rosuvastatin (CRESTOR) 10 MG tablet Take 10 mg by mouth daily.    [provider]  sertraline (ZOLOFT) 100 MG tablet Take 100 mg by mouth daily.    [provider]  spironolactone (ALDACTONE) 25 MG tablet Take 25 mg by mouth daily.    [provider]  temazepam (RESTORIL) 15 MG capsule Take by mouth.    [provider]  zolpidem (AMBIEN) 10 MG tablet Take 10 mg by mouth at bedtime as needed for sleep.    [provider]   Scheduled Meds:  enoxaparin (LOVENOX) injection  40 mg Subcutaneous Q24H   Continuous Infusions:  D-10-0.45% Sodium Chloride with KCL 1000 ml 100 mL/hr at 06/01/21 0733   famotidine (PEPCID) IV     potassium chloride     PRN Meds:.acetaminophen, docusate sodium, ondansetron (ZOFRAN) IV, polyethylene glycol  Active Hospital Problem list   Altered mental status Hypoglycemia Hypokalemia Leukocytosis CAD Congestive Diastolic heart failure Anxiety and depression PTSD Chronic low back pain  Assessment & Plan:  Severe Hypoglycemia secondary to Intentional Insulin overdose? (Apparently took total of 900 units of Lantus) Patient presenting with Neuroglycopenic symptoms of AMS, Seizures -Received D50W bolus in the ED -Monitor ABG/VBG to assess severity of acidemia -Troponin, EKG  shows no ischemia -Glucose: q2-4h until stable -Lab monitoring: 4h BMP+Phosphorus+pH (ABG/VBG)  -Continue  D5 1/2NS gtt -Start octreotide drip   -Hypoglycemic protocol -Follow ICU hyper/hypoglycemia protocol  Acute Metabolic Encephalopathy due to Hypoglycemia as above -Provide supportive care -CT head negative for acute intracranial abnormality  Seizures -hypoglycemic induced -Seizure precautions -Treat hypoglycemia as above -As needed lorazepam for breakthrough seizures  Hypokalemia K+ 2.6 on admission Reports history of frequent low p.o. K+, no prior work-up for alternative reasons for hypokalemia.  Prior concerns for hyperaldosteronism or renal potassium wasting -EKG and telemetry with frequent ectopy -Hold Lasix and ACE Inhibitors for now -Follow BMP -Replace electrolytes as  indicated  Leukocytosis : WBC: 18, reactive? No s/s of infectious process -Monitor fever curve -UA ending -CXR negative -Cultures pending -Trend WBC's and Procalcitonin  Chronic Diastolic HFpEF (last known EF 27% on 02/2017) -Nonischemic cardiomyopathy, hypertension Hx: CAD, HLD  -Continuous cardiac monitoring -Maintain MAP greater than 65 -Lasix as blood pressure and renal function permits; currently on Lasix 80 mg IV BID -Continue carvedilol, spironolactone as BP permits.  Hold Ramipril -Repeat 2D Echocardiogram   Anxiety and depression Hx: PTSD Now with concerns of intentional insulin overdose -Continue IVC until cleared by psych -Psych consult -Home meds: Hold Ambien and Restoril for now, will continue and clorazepate once able to take p.o.  Best practice:  Diet:  Oral Pain/Anxiety/Delirium protocol (if indicated): No VAP protocol (if indicated): Not indicated DVT prophylaxis: LMWH GI prophylaxis: PPI Glucose control:  SSI No Central venous access:  N/A Arterial line:  N/A Foley:  N/A Mobility:  bed rest  PT consulted: N/A Last date of multidisciplinary goals of care discussion [8/28] Code Status:  full code Disposition: ICU   = Goals of Care =   Primary Emergency Contact: Whitfield,Cecil Wishes to pursue full  aggressive treatment and intervention options, including CPR and intubation, goals of care will be addressed on going with family if that should become necessary.  Critical care time: 45 minutes     Webb Silversmith, DNP, CCRN, FNP-C, AGACNP-BC Acute Care Nurse Practitioner   Pulmonary & Critical Care Medicine Pager: [phone] [facility]  .

## 2021-06-01 NOTE — Consult Note (Signed)
PHARMACY CONSULT NOTE - FOLLOW UP  Pharmacy Consult for Electrolyte Monitoring and Replacement   Recent Labs: Potassium (mmol/L)  Date Value  06/01/2021 3.0 (L)  10/12/2013 3.3 (L)   Magnesium (mg/dL)  Date Value  09/47/0962 1.6 (L)   Calcium (mg/dL)  Date Value  83/66/2947 9.3   Calcium, Total (mg/dL)  Date Value  65/46/5035 8.9   Albumin (g/dL)  Date Value  46/56/8127 4.0   Sodium (mmol/L)  Date Value  06/01/2021 135  10/12/2013 136     Assessment: 55 yo female with past medical history of anxiety, CHF, HTN, and EtOH abuse presents to the emergency department with hypoglycemia and hypokalemia.  Pharmacy has been consulted to monitor and replenish electrolytes.  Pt was ordered KCL IV x 1(never given) and Kcl po x 1  Pt on IVF D10-1/2NS with Kcl @ 137ml/hr   Goal of Therapy:  Electrolytes wnl's  Plan:  --Will order IV KCL x 2 doses and continue IVF. --Will f/u K, Mg, Phos, and BMP with am labs  Bettey Costa, PharmD, BCPS Clinical Pharmacist 06/01/2021 4:10 PM

## 2021-06-02 ENCOUNTER — Inpatient Hospital Stay: Payer: Medicare Other

## 2021-06-02 DIAGNOSIS — T383X5A Adverse effect of insulin and oral hypoglycemic [antidiabetic] drugs, initial encounter: Secondary | ICD-10-CM

## 2021-06-02 DIAGNOSIS — E876 Hypokalemia: Secondary | ICD-10-CM

## 2021-06-02 DIAGNOSIS — E160 Drug-induced hypoglycemia without coma: Secondary | ICD-10-CM

## 2021-06-02 DIAGNOSIS — T1491XA Suicide attempt, initial encounter: Secondary | ICD-10-CM

## 2021-06-02 LAB — BASIC METABOLIC PANEL
Anion gap: 3 — ABNORMAL LOW (ref 5–15)
Anion gap: 6 (ref 5–15)
Anion gap: 6 (ref 5–15)
BUN: 8 mg/dL (ref 6–20)
BUN: 6 mg/dL (ref 6–20)
BUN: <5 mg/dL — ABNORMAL LOW (ref 6–20)
CO2: 24 mmol/L (ref 22–32)
CO2: 20 mmol/L — ABNORMAL LOW (ref 22–32)
CO2: 23 mmol/L (ref 22–32)
Calcium: 8.3 mg/dL — ABNORMAL LOW (ref 8.9–10.3)
Calcium: 8.4 mg/dL — ABNORMAL LOW (ref 8.9–10.3)
Calcium: 8.5 mg/dL — ABNORMAL LOW (ref 8.9–10.3)
Chloride: 110 mmol/L (ref 98–111)
Chloride: 112 mmol/L — ABNORMAL HIGH (ref 98–111)
Chloride: 107 mmol/L (ref 98–111)
Creatinine, Ser: 0.64 mg/dL (ref 0.44–1.00)
Creatinine, Ser: 0.66 mg/dL (ref 0.44–1.00)
Creatinine, Ser: 0.68 mg/dL (ref 0.44–1.00)
GFR, Estimated: >60 mL/min (ref >60)
GFR, Estimated: >60 mL/min (ref >60)
GFR, Estimated: >60 mL/min (ref >60)
Glucose, Bld: 84 mg/dL (ref 70–99)
Glucose, Bld: 116 mg/dL — ABNORMAL HIGH (ref 70–99)
Glucose, Bld: 114 mg/dL — ABNORMAL HIGH (ref 70–99)
Potassium: 4.1 mmol/L (ref 3.5–5.1)
Potassium: 3.9 mmol/L (ref 3.5–5.1)
Potassium: 3.4 mmol/L — ABNORMAL LOW (ref 3.5–5.1)
Sodium: 137 mmol/L (ref 135–145)
Sodium: 138 mmol/L (ref 135–145)
Sodium: 136 mmol/L (ref 135–145)

## 2021-06-02 LAB — GLUCOSE, CAPILLARY
Glucose-Capillary: 100 mg/dL — ABNORMAL HIGH (ref 70–99)
Glucose-Capillary: 107 mg/dL — ABNORMAL HIGH (ref 70–99)
Glucose-Capillary: 109 mg/dL — ABNORMAL HIGH (ref 70–99)
Glucose-Capillary: 110 mg/dL — ABNORMAL HIGH (ref 70–99)
Glucose-Capillary: 112 mg/dL — ABNORMAL HIGH (ref 70–99)
Glucose-Capillary: 119 mg/dL — ABNORMAL HIGH (ref 70–99)
Glucose-Capillary: 119 mg/dL — ABNORMAL HIGH (ref 70–99)
Glucose-Capillary: 121 mg/dL — ABNORMAL HIGH (ref 70–99)
Glucose-Capillary: 123 mg/dL — ABNORMAL HIGH (ref 70–99)
Glucose-Capillary: 124 mg/dL — ABNORMAL HIGH (ref 70–99)
Glucose-Capillary: 129 mg/dL — ABNORMAL HIGH (ref 70–99)
Glucose-Capillary: 132 mg/dL — ABNORMAL HIGH (ref 70–99)
Glucose-Capillary: 163 mg/dL — ABNORMAL HIGH (ref 70–99)
Glucose-Capillary: 17 mg/dL — CL (ref 70–99)
Glucose-Capillary: 190 mg/dL — ABNORMAL HIGH (ref 70–99)
Glucose-Capillary: 48 mg/dL — ABNORMAL LOW (ref 70–99)
Glucose-Capillary: 59 mg/dL — ABNORMAL LOW (ref 70–99)
Glucose-Capillary: 64 mg/dL — ABNORMAL LOW (ref 70–99)
Glucose-Capillary: 65 mg/dL — ABNORMAL LOW (ref 70–99)
Glucose-Capillary: 68 mg/dL — ABNORMAL LOW (ref 70–99)
Glucose-Capillary: 74 mg/dL (ref 70–99)
Glucose-Capillary: 82 mg/dL (ref 70–99)
Glucose-Capillary: 87 mg/dL (ref 70–99)
Glucose-Capillary: 89 mg/dL (ref 70–99)
Glucose-Capillary: 91 mg/dL (ref 70–99)
Glucose-Capillary: 92 mg/dL (ref 70–99)
Glucose-Capillary: 94 mg/dL (ref 70–99)
Glucose-Capillary: 94 mg/dL (ref 70–99)
Glucose-Capillary: 96 mg/dL (ref 70–99)

## 2021-06-02 LAB — CBC
HCT: 37.5 % (ref 36.0–46.0)
Hemoglobin: 11.9 g/dL — ABNORMAL LOW (ref 12.0–15.0)
MCH: 24.7 pg — ABNORMAL LOW (ref 26.0–34.0)
MCHC: 31.7 g/dL (ref 30.0–36.0)
MCV: 78 fL — ABNORMAL LOW (ref 80.0–100.0)
Platelets: 274 10*3/uL (ref 150–400)
RBC: 4.81 MIL/uL (ref 3.87–5.11)
RDW: 16.9 % — ABNORMAL HIGH (ref 11.5–15.5)
WBC: 9.7 10*3/uL (ref 4.0–10.5)
nRBC: 0 % (ref 0.0–0.2)

## 2021-06-02 LAB — MAGNESIUM: Magnesium: 2 mg/dL (ref 1.7–2.4)

## 2021-06-02 LAB — PROCALCITONIN: Procalcitonin: <0.1 ng/mL

## 2021-06-02 LAB — PHOSPHORUS: Phosphorus: 1.9 mg/dL — ABNORMAL LOW (ref 2.5–4.6)

## 2021-06-02 MED ORDER — SODIUM PHOSPHATES 45 MMOLE/15ML IV SOLN
30.0000 mmol | Freq: Once | INTRAVENOUS | Status: AC
  Administered 2021-06-02: 30 mmol via INTRAVENOUS
  Filled 2021-06-02: qty 10

## 2021-06-02 MED ORDER — CARVEDILOL 12.5 MG PO TABS: 12.5000 mg | ORAL_TABLET | Freq: Two times a day (BID) | ORAL | Status: DC

## 2021-06-02 MED ORDER — SODIUM CHLORIDE 4 MEQ/ML IV SOLN
INTRAVENOUS | Status: DC
  Filled 2021-06-02 (×11): qty 970.75

## 2021-06-02 MED ORDER — AMLODIPINE BESYLATE 5 MG PO TABS
5.0000 mg | ORAL_TABLET | Freq: Every day | ORAL | Status: DC
  Administered 2021-06-02 – 2021-06-04 (×3): 5 mg via ORAL
  Filled 2021-06-02 (×3): qty 1

## 2021-06-02 MED ORDER — CARVEDILOL 12.5 MG PO TABS
12.5000 mg | ORAL_TABLET | Freq: Two times a day (BID) | ORAL | Status: DC
  Administered 2021-06-02 – 2021-06-06 (×9): 12.5 mg via ORAL
  Filled 2021-06-02 (×9): qty 1

## 2021-06-02 MED ORDER — POTASSIUM CHLORIDE 2 MEQ/ML IV SOLN
INTRAVENOUS | Status: DC
  Filled 2021-06-02 (×2): qty 1000

## 2021-06-02 MED ORDER — FUROSEMIDE 40 MG PO TABS
40.0000 mg | ORAL_TABLET | Freq: Every day | ORAL | Status: DC
  Administered 2021-06-02 – 2021-06-06 (×5): 40 mg via ORAL
  Filled 2021-06-02: qty 2
  Filled 2021-06-02 (×2): qty 1
  Filled 2021-06-02 (×2): qty 2

## 2021-06-02 MED ORDER — SODIUM CHLORIDE 4 MEQ/ML IV SOLN
INTRAVENOUS | Status: DC
  Filled 2021-06-02 (×4): qty 960.75

## 2021-06-02 MED ORDER — SODIUM CHLORIDE 0.9 % IV SOLN
50.0000 ug/h | INTRAVENOUS | Status: AC
  Administered 2021-06-02 – 2021-06-03 (×2): 50 ug/h via INTRAVENOUS
  Filled 2021-06-02 (×5): qty 1

## 2021-06-02 NOTE — Progress Notes (Addendum)
PROGRESS NOTE    Katie [name]  RAX:[id number] DOB: 1965-11-08 DOA: 06/01/2021 PCP: [facility]   Chief complaint: altered mental status. Brief Narrative:  Katie [name] is a 55 year old female with history of nonischemic cardiomyopathy, obstructive sleep apnea, substance abuse, essential hypertension, coronary disease who present to the hospital with intentional insulin injection, hypoglycemia and altered mental status. Upon arriving the hospital, her potassium with 2.6, severe hypoglycemia.  She was admitted to the intensive care stepdown unit, was given D10 infusion, Solu-Cortef, octreotide.    Assessment & Plan:   Active Problems:   Suicide attempt (HCC)   Hypoglycemia due to insulin   Hypokalemia  #1.  Severe hypoglycemia secondary to intentional insulin overdose. Insulin overdose with suicide attempt. Acute toxic encephalopathy secondary to severe hypoglycemia. Seizure secondary to hypoglycemia. Patient glucose is stable on D10.  Completed 24 hours of octreotide.  Glucose still relatively low, will continue Solu-Cortef and D10 for another 24 hours.  D10 infusion rate is decreased to 167ml/hr. The insulin was injected more than 24 hours ago, however, due to large amount of insulin injection, Lantus effect may last longer than 24 hours. Patient glucose has been stable for the last 3 hours, however, patient mental status has not been improving.  Not sure days prominent brain damage from severe hypoglycemia.  We will follow and reassess after glucose is totally normalized. Continue monitor patient in the stepdown unit, most likely will transfer to psych unit tomorrow if condition is more stable.  #2.  Hypokalemia. Hypophosphatemia Secondary to insulin overdose.  Repleted.  #3.  Chronic diastolic congestive heart failure secondary to nonischemic cardiomyopathy. Coronary artery disease. Essential hypertension. Patient was given Lasix.  Currently  euvolemic. Since patient given large amount of IV fluids with D10, she will be monitored closely, IV Lasix if needed.  #4.  Suicide attempt. Anxiety depression  PTSD. Psychiatry consult obtained.    DVT prophylaxis: Lovenox Code Status: full Family Communication:  Disposition Plan:    Status is: Inpatient  Remains inpatient appropriate because:IV treatments appropriate due to intensity of illness or inability to take PO and Inpatient level of care appropriate due to severity of illness  Dispo: The patient is from: Home              Anticipated d/c is to:  Psych unit              Patient currently is not medically stable to d/c.   Difficult to place patient No        I/O last 3 completed shifts: In: 4317.9 [P.O.:410; I.V.:3466.3; IV Piggyback:441.7] Out: 4625 [Urine:4525; Emesis/NG output:100] Total I/O In: -  Out: 1800 [Urine:1800]     Consultants:  Psych  Procedures: None  Antimicrobials: None   Subjective: Patient still sleepy, confusion.  Glucose seem to be better.  D10 was reduced in dose. Denies any chest pain or palpitation. No short of breath or cough. Abdominal pain or nausea vomiting No dysuria hematuria.   Objective: Vitals:   06/02/21 0900 06/02/21 1000 06/02/21 1100 06/02/21 1200  BP: (!) 141/77 139/88 (!) 159/95 (!) 133/101  Pulse: 92 77 (!) 103 98  Resp: (!) 22 17 (!) 22 19  Temp:      TempSrc:      SpO2: 99% 98% 99% 98%  Weight:      Height:        Intake/Output Summary (Last 24 hours) at 06/02/2021 1252 Last data filed at 06/02/2021 1135 Gross per 24 hour  Intake 4317.93 ml  Output 6325 ml  Net -2007.07 ml   Filed Weights   06/01/21 0454 06/01/21 0900 06/02/21 0549  Weight: 83.9 kg 76 kg 76.7 kg    Examination:  General exam: Appears calm and comfortable  Respiratory system: Clear to auscultation. Respiratory effort normal. Cardiovascular system: S1 & S2 heard, RRR. No JVD, murmurs, rubs, gallops or clicks. No pedal  edema. Gastrointestinal system: Abdomen is nondistended, soft and nontender. No organomegaly or masses felt. Normal bowel sounds heard. Central nervous system: Drowsy and oriented x1.  No focal neurological deficits. Extremities: Symmetric 5 x 5 power. Skin: No rashes, lesions or ulcers     Data Reviewed: I have personally reviewed following labs and imaging studies  CBC: Recent Labs  Lab 06/01/21 0456 06/02/21 0200  WBC 18.0* 9.7  NEUTROABS 15.9*  --   HGB 12.4 11.9*  HCT 38.3 37.5  MCV 78.2* 78.0*  PLT 310 274   Basic Metabolic Panel: Recent Labs  Lab 06/01/21 0456 06/01/21 1050 06/01/21 1052 06/01/21 1356 06/01/21 1958 06/02/21 0200  NA 135  --   --   --  137 137  K 2.6*  --  2.8* 3.0* 4.0 4.1  CL 99  --   --   --  107 110  CO2 26  --   --   --  24 24  GLUCOSE 129*  --   --   --  267* 84  BUN 16  --   --   --  9 8  CREATININE 0.75  --   --   --  0.81 0.64  CALCIUM 9.3  --   --   --  8.3* 8.3*  MG 1.9 1.6*  --   --  2.2 2.0  PHOS  --   --   --   --  2.7 1.9*   GFR: Estimated Creatinine Clearance: 84.8 mL/min (by C-G formula based on SCr of 0.64 mg/dL). Liver Function Tests: Recent Labs  Lab 06/01/21 0456  AST 22  ALT 13  ALKPHOS 56  BILITOT 0.6  PROT 8.0  ALBUMIN 4.0   No results for input(s): LIPASE, AMYLASE in the last 168 hours. Recent Labs  Lab 06/01/21 0539  AMMONIA 14   Coagulation Profile: Recent Labs  Lab 06/01/21 0539  INR 1.1   Cardiac Enzymes: No results for input(s): CKTOTAL, CKMB, CKMBINDEX, TROPONINI in the last 168 hours. BNP (last 3 results) No results for input(s): PROBNP in the last 8760 hours. HbA1C: No results for input(s): HGBA1C in the last 72 hours. CBG: Recent Labs  Lab 06/02/21 0927 06/02/21 1032 06/02/21 1059 06/02/21 1114 06/02/21 1216  GLUCAP 87 94 64* 190* 124*   Lipid Profile: No results for input(s): CHOL, HDL, LDLCALC, TRIG, CHOLHDL, LDLDIRECT in the last 72 hours. Thyroid Function Tests: Recent  Labs    06/01/21 0456  TSH 0.702  FREET4 1.22*   Anemia Panel: No results for input(s): VITAMINB12, FOLATE, FERRITIN, TIBC, IRON, RETICCTPCT in the last 72 hours. Sepsis Labs: Recent Labs  Lab 06/01/21 0456 06/01/21 1052 06/02/21 0200  PROCALCITON  --  <0.10 <0.10  LATICACIDVEN 0.9 1.7  --     Recent Results (from the past 240 hour(s))  Resp Panel by RT-PCR (Flu A&B, Covid) Nasopharyngeal Swab     Status: None   Collection Time: 06/01/21  4:54 AM   Specimen: Nasopharyngeal Swab; Nasopharyngeal(NP) swabs in vial transport medium  Result Value Ref Range Status   SARS Coronavirus 2 by RT  PCR NEGATIVE NEGATIVE Final    Comment: (NOTE) SARS-CoV-2 target nucleic acids are NOT DETECTED.  The SARS-CoV-2 RNA is generally detectable in upper respiratory specimens during the acute phase of infection. The lowest concentration of SARS-CoV-2 viral copies this assay can detect is 138 copies/mL. A negative result does not preclude SARS-Cov-2 infection and should not be used as the sole basis for treatment or other patient management decisions. A negative result may occur with  improper specimen collection/handling, submission of specimen other than nasopharyngeal swab, presence of viral mutation(s) within the areas targeted by this assay, and inadequate number of viral copies(<138 copies/mL). A negative result must be combined with clinical observations, patient history, and epidemiological information. The expected result is Negative.  Fact Sheet for Patients:  BloggerCourse.com  Fact Sheet for Healthcare Providers:  SeriousBroker.it  This test is no t yet approved or cleared by the [place] FDA and  has been authorized for detection and/or diagnosis of SARS-CoV-2 by FDA under an Emergency Use Authorization (EUA). This EUA will remain  in effect (meaning this test can be used) for the duration of the COVID-19 declaration under  Section 564(b)(1) of the Act, 21 U.S.C.section 360bbb-3(b)(1), unless the authorization is terminated  or revoked sooner.       Influenza A by PCR NEGATIVE NEGATIVE Final   Influenza B by PCR NEGATIVE NEGATIVE Final    Comment: (NOTE) The Xpert Xpress SARS-CoV-2/FLU/RSV plus assay is intended as an aid in the diagnosis of influenza from Nasopharyngeal swab specimens and should not be used as a sole basis for treatment. Nasal washings and aspirates are unacceptable for Xpert Xpress SARS-CoV-2/FLU/RSV testing.  Fact Sheet for Patients: BloggerCourse.com  Fact Sheet for Healthcare Providers: SeriousBroker.it  This test is not yet approved or cleared by the [place] FDA and has been authorized for detection and/or diagnosis of SARS-CoV-2 by FDA under an Emergency Use Authorization (EUA). This EUA will remain in effect (meaning this test can be used) for the duration of the COVID-19 declaration under Section 564(b)(1) of the Act, 21 U.S.C. section 360bbb-3(b)(1), unless the authorization is terminated or revoked.  Performed at [facility], [address] [place], [place] [address]   Blood culture (routine single)     Status: None (Preliminary result)   Collection Time: 06/01/21  5:39 AM   Specimen: BLOOD  Result Value Ref Range Status   Specimen Description BLOOD BLOOD RIGHT ARM  Final   Special Requests   Final    BOTTLES DRAWN AEROBIC AND ANAEROBIC Blood Culture results may not be optimal due to an inadequate volume of blood received in culture bottles   Culture   Final    NO GROWTH 1 DAY Performed at [facility], [address], [place], [place] [address]    Report Status PENDING  Incomplete  MRSA Next Gen by PCR, Nasal     Status: Abnormal   Collection Time: 06/01/21  9:15 AM   Specimen: Nasal Mucosa; Nasal Swab  Result Value Ref Range Status   MRSA by PCR Next Gen DETECTED (A) NOT DETECTED  Final    Comment: RESULT CALLED TO, READ BACK BY AND VERIFIED WITH: HARRY PRESNELL AT 1119 06/01/21.PMF (NOTE) The GeneXpert MRSA Assay (FDA approved for NASAL specimens only), is one component of a comprehensive MRSA colonization surveillance program. It is not intended to diagnose MRSA infection nor to guide or monitor treatment for MRSA infections. Test performance is not FDA approved in patients less than 70 years old.  Performed at [facility], [address] [place], [place] [address]   CULTURE, BLOOD (ROUTINE X 2) w Reflex to ID Panel     Status: None (Preliminary result)   Collection Time: 06/01/21 10:52 AM   Specimen: BLOOD  Result Value Ref Range Status   Specimen Description BLOOD BLOOD RIGHT HAND  Final   Special Requests   Final    BOTTLES DRAWN AEROBIC AND ANAEROBIC Blood Culture adequate volume   Culture   Final    NO GROWTH < 24 HOURS Performed at [facility], [address], [place], [place] [address]    Report Status PENDING  Incomplete  Culture, blood (Routine X 2) w Reflex to ID Panel     Status: None (Preliminary result)   Collection Time: 06/01/21  1:56 PM   Specimen: BLOOD  Result Value Ref Range Status   Specimen Description BLOOD RIGHT WRIST  Final   Special Requests   Final    BOTTLES DRAWN AEROBIC AND ANAEROBIC Blood Culture adequate volume   Culture   Final    NO GROWTH < 24 HOURS Performed at [facility], [address], [place], [place] [address]    Report Status PENDING  Incomplete         Radiology Studies: CT HEAD WO CONTRAST ( )  Result Date: 06/01/2021 CLINICAL DATA:  55 year old female is unresponsive. Unexplained altered mental status. EXAM: CT HEAD WITHOUT CONTRAST TECHNIQUE: Contiguous axial images were obtained from the base of the skull through the vertex without intravenous contrast. COMPARISON:  Head CT 03/29/2008. FINDINGS: Brain: Cerebral volume is stable and within normal limits. No midline  shift, ventriculomegaly, mass effect, evidence of mass lesion, intracranial hemorrhage or evidence of cortically based acute infarction. Gray-white matter differentiation is within normal limits throughout the brain. No edema or encephalomalacia identified. Vascular: Calcified atherosclerosis at the skull base. No suspicious intracranial vascular hyperdensity. Skull: Intact, negative. Sinuses/Orbits: Interval left side maxillary antrostomy. Visualized paranasal sinuses and mastoids are clear. Other: Visualized orbits and scalp soft tissues are within normal limits. IMPRESSION: Normal noncontrast Head CT. Electronically Signed   By: Odessa Fleming M.D.   On: 06/01/2021 05:38   DG Chest Port 1 View  Result Date: 06/02/2021 CLINICAL DATA:  Pulmonary vascular congestion, insulin overdose EXAM: PORTABLE CHEST 1 VIEW COMPARISON:  06/01/2021 FINDINGS: Low lung volumes with vascular crowding. Mild interstitial edema is possible. However, there are no definite pleural effusions. No pneumothorax. The heart is mildly enlarged. IMPRESSION: Cardiomegaly with possible mild interstitial edema. No definite pleural effusions. Electronically Signed   By: Charline Bills M.D.   On: 06/02/2021 02:52   DG Chest Port 1 View  Result Date: 06/01/2021 CLINICAL DATA:  Sepsis. EXAM: PORTABLE CHEST 1 VIEW COMPARISON:  12/02/2015 FINDINGS: Normal heart size. Stable cardiomediastinal contours. Low lung volumes with pulmonary vascular congestion. Visualized osseous structures are unremarkable. IMPRESSION: 1. Low lung volumes and pulmonary vascular congestion Electronically Signed   By: Signa Kell M.D.   On: 06/01/2021 05:21        Scheduled Meds:  chlorhexidine  15 mL Mouth Rinse BID   Chlorhexidine Gluconate Cloth  6 each Topical Q0600   enoxaparin (LOVENOX) injection  40 mg Subcutaneous Q24H   hydrocortisone sod succinate (SOLU-CORTEF) inj  50 mg Intravenous Q8H   mouth rinse  15 mL Mouth Rinse q12n4p   pantoprazole  (PROTONIX) IV  40 mg Intravenous Q24H   Continuous Infusions:  D-10-0.45% Sodium Chloride with KCL 1000 ml 275  mL/hr at 06/02/21 0645   famotidine (PEPCID) IV Stopped (06/01/21 2235)   dextrose 10 % with additives Pediatric IV fluid       LOS: 1 day    Time spent: 32 minutes    Marrion Coyekui Antolin Belsito, MD [facility]   To contact the attending provider between 7A-7P or the covering provider during after hours 7P-7A, please log into the web site [email or web address] and access using universal [place] password for that web site. If you do not have the password, please call the hospital operator.  06/02/2021, 12:52 PM

## 2021-06-02 NOTE — Consult Note (Signed)
PHARMACY CONSULT NOTE  Pharmacy Consult for Electrolyte Monitoring and Replacement   Recent Labs: Potassium (mmol/L)  Date Value  06/02/2021 4.1  10/12/2013 3.3 (L)   Magnesium (mg/dL)  Date Value  60/73/7106 2.0   Calcium (mg/dL)  Date Value  26/94/8546 8.3 (L)   Calcium, Total (mg/dL)  Date Value  27/12/5007 8.9   Albumin (g/dL)  Date Value  38/18/2993 4.0   Phosphorus (mg/dL)  Date Value  71/69/6789 1.9 (L)   Sodium (mmol/L)  Date Value  06/02/2021 137  10/12/2013 136     Assessment: 55 yo female with past medical history of anxiety, CHF, HTN, and EtOH abuse presented to the emergency department with hypoglycemia and hypokalemia.  Pharmacy has been consulted to monitor and replenish electrolytes.  MIVF: D10-1/2NS with Kcl @ 6ml/hr   Goal of Therapy:  Electrolytes wnl's  Plan:  30 mmol IV sodium phosphate x 1 per NP Continue D10-1/2NS with Kcl/L @ 53ml/hr f/u K, Mg, Phos, and BMP with am labs  Lowella Bandy, PharmD, BCPS Clinical Pharmacist 06/02/2021 7:22 AM

## 2021-06-03 DIAGNOSIS — F332 Major depressive disorder, recurrent severe without psychotic features: Secondary | ICD-10-CM | POA: Diagnosis present

## 2021-06-03 DIAGNOSIS — T383X1A Poisoning by insulin and oral hypoglycemic [antidiabetic] drugs, accidental (unintentional), initial encounter: Secondary | ICD-10-CM

## 2021-06-03 DIAGNOSIS — F4312 Post-traumatic stress disorder, chronic: Secondary | ICD-10-CM | POA: Diagnosis present

## 2021-06-03 DIAGNOSIS — I429 Cardiomyopathy, unspecified: Secondary | ICD-10-CM | POA: Insufficient documentation

## 2021-06-03 LAB — PHOSPHORUS: Phosphorus: 2.8 mg/dL (ref 2.5–4.6)

## 2021-06-03 LAB — BASIC METABOLIC PANEL
Anion gap: 8 (ref 5–15)
Anion gap: 7 (ref 5–15)
Anion gap: 8 (ref 5–15)
BUN: 5 mg/dL — ABNORMAL LOW (ref 6–20)
BUN: <5 mg/dL — ABNORMAL LOW (ref 6–20)
BUN: 5 mg/dL — ABNORMAL LOW (ref 6–20)
CO2: 25 mmol/L (ref 22–32)
CO2: 23 mmol/L (ref 22–32)
CO2: 24 mmol/L (ref 22–32)
Calcium: 8.3 mg/dL — ABNORMAL LOW (ref 8.9–10.3)
Calcium: 8.8 mg/dL — ABNORMAL LOW (ref 8.9–10.3)
Calcium: 8.7 mg/dL — ABNORMAL LOW (ref 8.9–10.3)
Chloride: 106 mmol/L (ref 98–111)
Chloride: 107 mmol/L (ref 98–111)
Chloride: 104 mmol/L (ref 98–111)
Creatinine, Ser: 0.74 mg/dL (ref 0.44–1.00)
Creatinine, Ser: 0.7 mg/dL (ref 0.44–1.00)
Creatinine, Ser: 0.8 mg/dL (ref 0.44–1.00)
GFR, Estimated: >60 mL/min (ref >60)
GFR, Estimated: >60 mL/min (ref >60)
GFR, Estimated: >60 mL/min (ref >60)
Glucose, Bld: 87 mg/dL (ref 70–99)
Glucose, Bld: 83 mg/dL (ref 70–99)
Glucose, Bld: 143 mg/dL — ABNORMAL HIGH (ref 70–99)
Potassium: 3.5 mmol/L (ref 3.5–5.1)
Potassium: 4.1 mmol/L (ref 3.5–5.1)
Potassium: 3.9 mmol/L (ref 3.5–5.1)
Sodium: 139 mmol/L (ref 135–145)
Sodium: 137 mmol/L (ref 135–145)
Sodium: 136 mmol/L (ref 135–145)

## 2021-06-03 LAB — CBC WITH DIFFERENTIAL/PLATELET
Abs Immature Granulocytes: 0.04 10*3/uL (ref 0.00–0.07)
Basophils Absolute: 0.1 10*3/uL (ref 0.0–0.1)
Basophils Relative: 1 %
Eosinophils Absolute: 0 10*3/uL (ref 0.0–0.5)
Eosinophils Relative: 0 %
HCT: 40.7 % (ref 36.0–46.0)
Hemoglobin: 13 g/dL (ref 12.0–15.0)
Immature Granulocytes: 0 %
Lymphocytes Relative: 23 %
Lymphs Abs: 2.1 10*3/uL (ref 0.7–4.0)
MCH: 25.3 pg — ABNORMAL LOW (ref 26.0–34.0)
MCHC: 31.9 g/dL (ref 30.0–36.0)
MCV: 79.3 fL — ABNORMAL LOW (ref 80.0–100.0)
Monocytes Absolute: 1 10*3/uL (ref 0.1–1.0)
Monocytes Relative: 11 %
Neutro Abs: 5.9 10*3/uL (ref 1.7–7.7)
Neutrophils Relative %: 65 %
Platelets: 272 10*3/uL (ref 150–400)
RBC: 5.13 MIL/uL — ABNORMAL HIGH (ref 3.87–5.11)
RDW: 17.2 % — ABNORMAL HIGH (ref 11.5–15.5)
WBC: 9.1 10*3/uL (ref 4.0–10.5)
nRBC: 0 % (ref 0.0–0.2)

## 2021-06-03 LAB — GLUCOSE, CAPILLARY
Glucose-Capillary: 100 mg/dL — ABNORMAL HIGH (ref 70–99)
Glucose-Capillary: 107 mg/dL — ABNORMAL HIGH (ref 70–99)
Glucose-Capillary: 109 mg/dL — ABNORMAL HIGH (ref 70–99)
Glucose-Capillary: 130 mg/dL — ABNORMAL HIGH (ref 70–99)
Glucose-Capillary: 138 mg/dL — ABNORMAL HIGH (ref 70–99)
Glucose-Capillary: 143 mg/dL — ABNORMAL HIGH (ref 70–99)
Glucose-Capillary: 145 mg/dL — ABNORMAL HIGH (ref 70–99)
Glucose-Capillary: 148 mg/dL — ABNORMAL HIGH (ref 70–99)
Glucose-Capillary: 153 mg/dL — ABNORMAL HIGH (ref 70–99)
Glucose-Capillary: 164 mg/dL — ABNORMAL HIGH (ref 70–99)
Glucose-Capillary: 164 mg/dL — ABNORMAL HIGH (ref 70–99)
Glucose-Capillary: 165 mg/dL — ABNORMAL HIGH (ref 70–99)
Glucose-Capillary: 177 mg/dL — ABNORMAL HIGH (ref 70–99)
Glucose-Capillary: 66 mg/dL — ABNORMAL LOW (ref 70–99)
Glucose-Capillary: 75 mg/dL (ref 70–99)
Glucose-Capillary: 82 mg/dL (ref 70–99)
Glucose-Capillary: 86 mg/dL (ref 70–99)
Glucose-Capillary: 87 mg/dL (ref 70–99)

## 2021-06-03 LAB — MAGNESIUM: Magnesium: 1.7 mg/dL (ref 1.7–2.4)

## 2021-06-03 MED ORDER — SODIUM CHLORIDE 0.9 % IV SOLN
Freq: Two times a day (BID) | INTRAVENOUS | Status: DC
  Filled 2021-06-03 (×3): qty 2

## 2021-06-03 MED ORDER — MUPIROCIN 2 % EX OINT
1.0000 "application " | TOPICAL_OINTMENT | Freq: Two times a day (BID) | CUTANEOUS | Status: DC
  Administered 2021-06-03 – 2021-06-04 (×4): 1 via NASAL
  Filled 2021-06-03: qty 22

## 2021-06-03 MED ORDER — POTASSIUM CHLORIDE 2 MEQ/ML IV SOLN
INTRAVENOUS | Status: DC
  Filled 2021-06-03 (×7): qty 1000

## 2021-06-03 MED ORDER — PANTOPRAZOLE SODIUM 40 MG PO TBEC
40.0000 mg | DELAYED_RELEASE_TABLET | Freq: Every day | ORAL | Status: DC
  Administered 2021-06-03 – 2021-06-05 (×3): 40 mg via ORAL
  Filled 2021-06-03 (×3): qty 1

## 2021-06-03 MED ORDER — FAMOTIDINE 20 MG IN NS 100 ML IVPB
20.0000 mg | Freq: Two times a day (BID) | INTRAVENOUS | Status: DC
  Filled 2021-06-03 (×2): qty 100

## 2021-06-03 MED ORDER — SERTRALINE HCL 50 MG PO TABS
100.0000 mg | ORAL_TABLET | Freq: Every day | ORAL | Status: DC
  Administered 2021-06-03 – 2021-06-06 (×4): 100 mg via ORAL
  Filled 2021-06-03 (×4): qty 2

## 2021-06-03 MED ORDER — FAMOTIDINE IN NACL 20-0.9 MG/50ML-% IV SOLN: 20.0000 mg | Freq: Two times a day (BID) | INTRAVENOUS | Status: DC

## 2021-06-03 MED ORDER — MAGNESIUM SULFATE 2 GM/50ML IV SOLN
2.0000 g | Freq: Once | INTRAVENOUS | Status: AC
  Administered 2021-06-03: 2 g via INTRAVENOUS
  Filled 2021-06-03 (×2): qty 50

## 2021-06-03 MED ORDER — POTASSIUM CHLORIDE 20 MEQ PO PACK
40.0000 meq | PACK | Freq: Once | ORAL | Status: AC
  Administered 2021-06-03: 40 meq via ORAL
  Filled 2021-06-03: qty 2

## 2021-06-03 MED ORDER — DEXTROSE 50 % IV SOLN: 25.0000 mL | INTRAVENOUS | Status: DC | PRN

## 2021-06-03 NOTE — Progress Notes (Signed)
PROGRESS NOTE    Katie [name]  HYI:[id number] DOB: 21-Nov-1965 DOA: 06/01/2021 PCP: [facility]   Chief complaint.  Altered mental status. Brief Narrative:  Katie [name] is a 55 year old female with history of nonischemic cardiomyopathy, obstructive sleep apnea, substance abuse, essential hypertension, coronary disease who present to the hospital with intentional insulin injection, hypoglycemia and altered mental status. Upon arriving the hospital, her potassium with 2.6, severe hypoglycemia.  She was admitted to the intensive care stepdown unit, was given D10 infusion, Solu-Cortef, octreotide.   Assessment & Plan:   Active Problems:   Suicide attempt ([place])   Hypoglycemia due to insulin   Hypokalemia  #1.  Severe hypoglycemia secondary to intentional insulin overdose. Insulin overdose with suicide attempt. Acute toxic encephalopathy secondary to severe hypoglycemia. Seizure secondary to hypoglycemia. Patient still has significant hypoglycemia earlier this morning. She is continued on Solu-Cortef and octreotide.  She was continued on D10 at 200 mL/h. Currently glucose is better, reduce D10 to 100 mL/h.  Gradually wean off.  It is also eating.  Her mental status gradually improving, but still disoriented to time. Will need to reassess her mental status after fully improved. Patient need to be transferred to mental health when condition stable.    #2.  Hypokalemia. Hypophosphatemia Improving, will give additional 40 mEq oral potassium for potassium 3.5.   #3.  Chronic diastolic congestive heart failure secondary to nonischemic cardiomyopathy. Coronary artery disease. Essential hypertension. Added Norvasc for elevated blood pressure Restart oral Lasix 40 mg daily due to the fact the patient receiving massive amount of fluid due to hypoglycemia. Currently, patient does not have evidence of volume overload.   #4.  Suicide attempt. Anxiety depression   PTSD. Transfer to mental health when condition is stable     DVT prophylaxis: Lovenox Code Status: full Family Communication: Met and updated husband yesterday evening. Disposition Plan:    Status is: Inpatient  Remains inpatient appropriate because:Altered mental status, IV treatments appropriate due to intensity of illness or inability to take PO, and Inpatient level of care appropriate due to severity of illness  Dispo: The patient is from: Home              Anticipated d/c is to: Home              Patient currently is not medically stable to d/c.   Difficult to place patient No        I/O last 3 completed shifts: In: 4190.8 [P.O.:120; I.V.:3853.5; IV Piggyback:217.3] Out: 78676 [Urine:10225] Total I/O In: 240 [P.O.:240] Out: -      Consultants:  Psych  Procedures: None  Antimicrobials: None Subjective: Patient appetite is improving, glucose still low. Still has some confusion, but no agitation.  More awake today. Denies any short of breath or cough. No abdominal pain or nausea vomiting. No fever chills No dysuria hematuria No chest pain or palpitation.  Objective: Vitals:   06/03/21 0600 06/03/21 0700 06/03/21 0800 06/03/21 0900  BP: (!) 161/95 (!) 169/105 (!) 177/102 (!) 161/98  Pulse: 76 87 95 98  Resp: 18 (!) 22 19 (!) 22  Temp:   98.7 F (37.1 C)   TempSrc:      SpO2: 97% 97% 97% 97%  Weight:      Height:        Intake/Output Summary (Last 24 hours) at 06/03/2021 1044 Last data filed at 06/03/2021 0800 Gross per 24 hour  Intake 941.26 ml  Output 7225 ml  Net -6283.74  ml   Filed Weights   06/01/21 0900 06/02/21 0549 06/03/21 0500  Weight: 76 kg 76.7 kg 74.1 kg    Examination:  General exam: Appears calm and comfortable  Respiratory system: Clear to auscultation. Respiratory effort normal. Cardiovascular system: S1 & S2 heard, RRR. No JVD, murmurs, rubs, gallops or clicks. No pedal edema. Gastrointestinal system: Abdomen is  nondistended, soft and nontender. No organomegaly or masses felt. Normal bowel sounds heard. Central nervous system: Alert and oriented x2. No focal neurological deficits. Extremities: Symmetric 5 x 5 power. Skin: No rashes, lesions or ulcers Psychiatry: Judgement and insight appear normal. Mood & affect appropriate.     Data Reviewed: I have personally reviewed following labs and imaging studies  CBC: Recent Labs  Lab 06/01/21 0456 06/02/21 0200 06/03/21 0600  WBC 18.0* 9.7 9.1  NEUTROABS 15.9*  --  5.9  HGB 12.4 11.9* 13.0  HCT 38.3 37.5 40.7  MCV 78.2* 78.0* 79.3*  PLT 310 274 277   Basic Metabolic Panel: Recent Labs  Lab 06/01/21 0456 06/01/21 1050 06/01/21 1052 06/01/21 1958 06/02/21 0200 06/02/21 1530 06/02/21 2233 06/03/21 0600  NA 135  --   --  137 137 138 136 139  K 2.6*  --    < > 4.0 4.1 3.9 3.4* 3.5  CL 99  --   --  107 110 112* 107 106  CO2 26  --   --  24 24 20* 23 25  GLUCOSE 129*  --   --  267* 84 116* 114* 87  BUN 16  --   --  9 8 6  <5* 5*  CREATININE 0.75  --   --  0.81 0.64 0.66 0.68 0.74  CALCIUM 9.3  --   --  8.3* 8.3* 8.4* 8.5* 8.3*  MG 1.9 1.6*  --  2.2 2.0  --   --  1.7  PHOS  --   --   --  2.7 1.9*  --   --  2.8   < > = values in this interval not displayed.   GFR: Estimated Creatinine Clearance: 83.5 mL/min (by C-G formula based on SCr of 0.74 mg/dL). Liver Function Tests: Recent Labs  Lab 06/01/21 0456  AST 22  ALT 13  ALKPHOS 56  BILITOT 0.6  PROT 8.0  ALBUMIN 4.0   No results for input(s): LIPASE, AMYLASE in the last 168 hours. Recent Labs  Lab 06/01/21 0539  AMMONIA 14   Coagulation Profile: Recent Labs  Lab 06/01/21 0539  INR 1.1   Cardiac Enzymes: No results for input(s): CKTOTAL, CKMB, CKMBINDEX, TROPONINI in the last 168 hours. BNP (last 3 results) No results for input(s): PROBNP in the last 8760 hours. HbA1C: No results for input(s): HGBA1C in the last 72 hours. CBG: Recent Labs  Lab 06/03/21 0605  06/03/21 0750 06/03/21 0830 06/03/21 0928 06/03/21 1026  GLUCAP 86 66* 138* 165* 177*   Lipid Profile: No results for input(s): CHOL, HDL, LDLCALC, TRIG, CHOLHDL, LDLDIRECT in the last 72 hours. Thyroid Function Tests: Recent Labs    06/01/21 0456  TSH 0.702  FREET4 1.22*   Anemia Panel: No results for input(s): VITAMINB12, FOLATE, FERRITIN, TIBC, IRON, RETICCTPCT in the last 72 hours. Sepsis Labs: Recent Labs  Lab 06/01/21 0456 06/01/21 1052 06/02/21 0200  PROCALCITON  --  <0.10 <0.10  LATICACIDVEN 0.9 1.7  --     Recent Results (from the past 240 hour(s))  Resp Panel by RT-PCR (Flu A&B, Covid) Nasopharyngeal Swab  Status: None   Collection Time: 06/01/21  4:54 AM   Specimen: Nasopharyngeal Swab; Nasopharyngeal(NP) swabs in vial transport medium  Result Value Ref Range Status   SARS Coronavirus 2 by RT PCR NEGATIVE NEGATIVE Final    Comment: (NOTE) SARS-CoV-2 target nucleic acids are NOT DETECTED.  The SARS-CoV-2 RNA is generally detectable in upper respiratory specimens during the acute phase of infection. The lowest concentration of SARS-CoV-2 viral copies this assay can detect is 138 copies/mL. A negative result does not preclude SARS-Cov-2 infection and should not be used as the sole basis for treatment or other patient management decisions. A negative result may occur with  improper specimen collection/handling, submission of specimen other than nasopharyngeal swab, presence of viral mutation(s) within the areas targeted by this assay, and inadequate number of viral copies(<138 copies/mL). A negative result must be combined with clinical observations, patient history, and epidemiological information. The expected result is Negative.  Fact Sheet for Patients:  EntrepreneurPulse.com.au  Fact Sheet for Healthcare Providers:  IncredibleEmployment.be  This test is no t yet approved or cleared by the [place] FDA and   has been authorized for detection and/or diagnosis of SARS-CoV-2 by FDA under an Emergency Use Authorization (EUA). This EUA will remain  in effect (meaning this test can be used) for the duration of the COVID-19 declaration under Section 564(b)(1) of the Act, 21 U.S.C.section 360bbb-3(b)(1), unless the authorization is terminated  or revoked sooner.       Influenza A by PCR NEGATIVE NEGATIVE Final   Influenza B by PCR NEGATIVE NEGATIVE Final    Comment: (NOTE) The Xpert Xpress SARS-CoV-2/FLU/RSV plus assay is intended as an aid in the diagnosis of influenza from Nasopharyngeal swab specimens and should not be used as a sole basis for treatment. Nasal washings and aspirates are unacceptable for Xpert Xpress SARS-CoV-2/FLU/RSV testing.  Fact Sheet for Patients: EntrepreneurPulse.com.au  Fact Sheet for Healthcare Providers: IncredibleEmployment.be  This test is not yet approved or cleared by the [place] FDA and has been authorized for detection and/or diagnosis of SARS-CoV-2 by FDA under an Emergency Use Authorization (EUA). This EUA will remain in effect (meaning this test can be used) for the duration of the COVID-19 declaration under Section 564(b)(1) of the Act, 21 U.S.C. section 360bbb-3(b)(1), unless the authorization is terminated or revoked.  Performed at [facility], [address] [place], [place] [address]   Blood culture (routine single)     Status: None (Preliminary result)   Collection Time: 06/01/21  5:39 AM   Specimen: BLOOD  Result Value Ref Range Status   Specimen Description BLOOD BLOOD RIGHT ARM  Final   Special Requests   Final    BOTTLES DRAWN AEROBIC AND ANAEROBIC Blood Culture results may not be optimal due to an inadequate volume of blood received in culture bottles   Culture  Setup Time   Final    GRAM POSITIVE RODS BOTTLES DRAWN AEROBIC ONLY CRITICAL RESULT CALLED TO, READ BACK BY AND VERIFIED  WITH: JASON ROBBINS@[id number]  06/03/21 RH Performed at [facility], [address], [place], [place] [address]    Culture GRAM POSITIVE RODS  Final   Report Status PENDING  Incomplete  MRSA Next Gen by PCR, Nasal     Status: Abnormal   Collection Time: 06/01/21  9:15 AM   Specimen: Nasal Mucosa; Nasal Swab  Result Value Ref Range Status   MRSA by PCR Next Gen DETECTED (A) NOT DETECTED Final    Comment: RESULT CALLED TO,  READ BACK BY AND VERIFIED WITH: HARRY PRESNELL AT 1119 06/01/21.PMF (NOTE) The GeneXpert MRSA Assay (FDA approved for NASAL specimens only), is one component of a comprehensive MRSA colonization surveillance program. It is not intended to diagnose MRSA infection nor to guide or monitor treatment for MRSA infections. Test performance is not FDA approved in patients less than 71 years old. Performed at [facility], [address] [place], [place] [address]   CULTURE, BLOOD (ROUTINE X 2) w Reflex to ID Panel     Status: None (Preliminary result)   Collection Time: 06/01/21 10:52 AM   Specimen: BLOOD  Result Value Ref Range Status   Specimen Description BLOOD BLOOD RIGHT HAND  Final   Special Requests   Final    BOTTLES DRAWN AEROBIC AND ANAEROBIC Blood Culture adequate volume   Culture   Final    NO GROWTH 2 DAYS Performed at [facility], [address], [place], [place] [address]    Report Status PENDING  Incomplete  Culture, blood (Routine X 2) w Reflex to ID Panel     Status: None (Preliminary result)   Collection Time: 06/01/21  1:56 PM   Specimen: BLOOD  Result Value Ref Range Status   Specimen Description BLOOD RIGHT WRIST  Final   Special Requests   Final    BOTTLES DRAWN AEROBIC AND ANAEROBIC Blood Culture adequate volume   Culture   Final    NO GROWTH 2 DAYS Performed at [facility], [address], [place], [place] [address]    Report Status PENDING  Incomplete         Radiology Studies: DG Chest Port 1  View  Result Date: 06/02/2021 CLINICAL DATA:  Pulmonary vascular congestion, insulin overdose EXAM: PORTABLE CHEST 1 VIEW COMPARISON:  06/01/2021 FINDINGS: Low lung volumes with vascular crowding. Mild interstitial edema is possible. However, there are no definite pleural effusions. No pneumothorax. The heart is mildly enlarged. IMPRESSION: Cardiomegaly with possible mild interstitial edema. No definite pleural effusions. Electronically Signed   By: Julian Hy M.D.   On: 06/02/2021 02:52        Scheduled Meds:  amLODipine  5 mg Oral Daily   carvedilol  12.5 mg Oral BID WC   chlorhexidine  15 mL Mouth Rinse BID   Chlorhexidine Gluconate Cloth  6 each Topical Q0600   enoxaparin (LOVENOX) injection  40 mg Subcutaneous Q24H   furosemide  40 mg Oral Daily   hydrocortisone sod succinate (SOLU-CORTEF) inj  50 mg Intravenous Q8H   mouth rinse  15 mL Mouth Rinse q12n4p   sertraline  100 mg Oral Daily   Continuous Infusions:  small volume/piggyback builder 60 mL/hr at 06/03/21 0302   magnesium sulfate bolus IVPB 2 g (06/03/21 1015)   octreotide  (SANDOSTATIN)    IV infusion 50 mcg/hr (06/03/21 0419)   dextrose 10 % with additives Pediatric IV fluid 200 mL/hr at 06/03/21 1018     LOS: 2 days    Time spent: 35 minutes    Sharen Hones, MD [facility]   To contact the attending provider between 7A-7P or the covering provider during after hours 7P-7A, please log into the web site [email or web address] and access using universal [place] password for that web site. If you do not have the password, please call the hospital operator.  06/03/2021, 10:44 AM

## 2021-06-03 NOTE — Progress Notes (Signed)
PHARMACY - PHYSICIAN COMMUNICATION CRITICAL VALUE ALERT - BLOOD CULTURE IDENTIFICATION (BCID)  Katie [name] is an 55 y.o. female who presented to [facility] on 06/01/2021 with a chief complaint of suicide attempt, hypoglycemia.   Assessment:  Gram positive rods in 1 of 2 bottles.  No signs of infection , most likely a contaminant.   (include suspected source if known)  Name of physician (or Provider) Contacted: Cliffton Asters  Current antibiotics: none  Changes to prescribed antibiotics recommended:  No, NP agreed is most likely contaminant so will not start abx.   No results found for this or any previous visit.  Analyah Mcconnon D 06/03/2021  1:43 AM

## 2021-06-03 NOTE — Consult Note (Signed)
PHARMACY CONSULT NOTE  Pharmacy Consult for Electrolyte Monitoring and Replacement   Recent Labs: Potassium (mmol/L)  Date Value  06/03/2021 3.5  10/12/2013 3.3 (L)   Magnesium (mg/dL)  Date Value  07/62/2633 1.7   Calcium (mg/dL)  Date Value  35/45/6256 8.3 (L)   Calcium, Total (mg/dL)  Date Value  38/93/7342 8.9   Albumin (g/dL)  Date Value  87/68/1157 4.0   Phosphorus (mg/dL)  Date Value  26/20/3559 2.8   Sodium (mmol/L)  Date Value  06/03/2021 139  10/12/2013 136     Assessment: 55 yo female with past medical history of anxiety, CHF, HTN, and EtOH abuse presented to the emergency department with hypoglycemia and hypokalemia.  Pharmacy has been consulted to monitor and replenish electrolytes.  MIVF: D10-1/2NS with KCl/L @ 100 ml/hr   Goal of Therapy:  Electrolytes wnl's  Plan:  2 grams IV magnesium sulfate x 1 Continue D10-1/2NS with KCl/L @ 100 ml/hr  f/u K, Mg, Phos, and BMP with am labs  Lowella Bandy, PharmD, BCPS Clinical Pharmacist 06/03/2021 7:41 AM

## 2021-06-03 NOTE — Consult Note (Signed)
[facility] Face-to-Face Psychiatry Consult   Reason for Consult:  Suicide attempt via insulin injection Referring Physician:  Dr. Marrion Coy, MD Patient Identification: Katie [name] MRN:  [id number] Principal Diagnosis: <principal problem not specified> Diagnosis:  Active Problems:   Suicide attempt (HCC)   Hypoglycemia due to insulin   Hypokalemia   MDD (major depressive disorder), recurrent severe, without psychosis (HCC)   Chronic post-traumatic stress disorder (PTSD)   Total Time spent with patient: 1 hour  CC "It was stupid."  Subjective:   [name] is a 55 y.o. female patient admitted with hypoglycemia and hypokalemia secondary to intentional insulin injection. Psychiatry was consulted due to suicide attempt.   HPI:  55 year old female with history of major depressive disorder and PTSD treated by [facility]. Patient seen one-on-one at bedside today. She is speaking at a barely audible whisper, but her thought process is coherent, and she is oriented at time of interview. She does admit to trying to kill herself with insuline, and states "it was stupid." She regrets trying to end her life, and notes that her family is very supportive of her. She wants to make sure it is understood that her family was not the cause of her wanting to end her life. She states her most recent episode of depression stemmed from joining a support group on Facebook. Initially, patient found this to be an enjoyable new form of social support with occasional messages and calls, but it quickly escalated into a time consuming strain. She has people from all over the country messaging her and calling her at all hours of the day and night, and she was unable to sleep. She became increasingly stressed out, but noted she was unable to successfully set limits with this group out of guilt. She was sleeping roughly 2-3 hours due to phone calls, but felt tremendously tired with low energy and  hopelessness. Eventually, she felt the only way to escape this group was to end  her life, and she took some of her mother's old insulin that was in the house. She regrets this attempt now. She has been seen by [facility] for a number of years, and felt her Zoloft and Prazosin were helpful for her. She gives me permission to speak to her husband.   Contacted husband, Katie [name], [phone]. Husband confirms that she was on a social media group, and people were indeed calling her at all hours of the day including 2-3AM. He knew she was stressed from this and told her to get off social media or block the group members. He thought she had improved, and initially when he got home from work she was acting normally and they got in the hot tub. Then she became altered and required EMS. It was not until she arrived at the hospital that she finally admitted to taking insulin.   Past Psychiatric History: Seen by [facility] in [place]. Prescribed temazepam 15 mg QHS, Ambien 10 mg QHS, prazosin 2 mg QHS, and sertraline 100 mg daily. Has seen therapist in the past, but not currently. No other previous suicide attempts, no hospital stays.   Risk to Self:   Risk to Others:   Prior Inpatient Therapy:   Prior Outpatient Therapy:    Past Medical History:  Past Medical History:  Diagnosis Date   Anxiety    Cardiomyopathy (HCC)    CHF (congestive heart failure) (HCC)    Hypertension    Renal insufficiency    Substance abuse (HCC)  alcohol   No past surgical history on file. Family History: No family history on file. Family Psychiatric  History: Denies Social History:  Social History   Substance and Sexual Activity  Alcohol Use No     Social History   Substance and Sexual Activity  Drug Use Not on file    Social History   Socioeconomic History   Marital status: Married    Spouse name: Not on file   Number of children: Not on file   Years of education: Not on file   Highest education level: Not on file   Occupational History   Not on file  Tobacco Use   Smoking status: Never   Smokeless tobacco: Never  Vaping Use   Vaping Use: Never used  Substance and Sexual Activity   Alcohol use: No   Drug use: Not on file   Sexual activity: Not on file  Other Topics Concern   Not on file  Social History Narrative   Not on file   Social Determinants of Health   Financial Resource Strain: Not on file  Food Insecurity: Not on file  Transportation Needs: Not on file  Physical Activity: Not on file  Stress: Not on file  Social Connections: Not on file   Additional Social History:    Allergies:   Allergies  Allergen Reactions   Wasp Venom Protein Shortness Of Breath   Ciprofloxacin Rash and Other (See Comments)    Other Reaction: HIVES AND BLISTERS    Labs:  Results for orders placed or performed during the hospital encounter of 06/01/21 (from the past 48 hour(s))  Glucose, capillary     Status: Abnormal   Collection Time: 06/01/21  4:14 PM  Result Value Ref Range   Glucose-Capillary 142 (H) 70 - 99 mg/dL    Comment: Glucose reference range applies only to samples taken after fasting for at least 8 hours.  Glucose, capillary     Status: Abnormal   Collection Time: 06/01/21  5:16 PM  Result Value Ref Range   Glucose-Capillary 41 (LL) 70 - 99 mg/dL    Comment: Glucose reference range applies only to samples taken after fasting for at least 8 hours.   Comment 1 Notify RN   Glucose, capillary     Status: Abnormal   Collection Time: 06/01/21  5:45 PM  Result Value Ref Range   Glucose-Capillary 151 (H) 70 - 99 mg/dL    Comment: Glucose reference range applies only to samples taken after fasting for at least 8 hours.  Glucose, capillary     Status: Abnormal   Collection Time: 06/01/21  6:41 PM  Result Value Ref Range   Glucose-Capillary 52 (L) 70 - 99 mg/dL    Comment: Glucose reference range applies only to samples taken after fasting for at least 8 hours.  Glucose, capillary      Status: Abnormal   Collection Time: 06/01/21  6:55 PM  Result Value Ref Range   Glucose-Capillary 153 (H) 70 - 99 mg/dL    Comment: Glucose reference range applies only to samples taken after fasting for at least 8 hours.  Glucose, capillary     Status: Abnormal   Collection Time: 06/01/21  7:56 PM  Result Value Ref Range   Glucose-Capillary 53 (L) 70 - 99 mg/dL    Comment: Glucose reference range applies only to samples taken after fasting for at least 8 hours.   Comment 1 Notify RN    Comment 2 Document in Chart  Basic metabolic panel     Status: Abnormal   Collection Time: 06/01/21  7:58 PM  Result Value Ref Range   Sodium 137 135 - 145 mmol/L   Potassium 4.0 3.5 - 5.1 mmol/L   Chloride 107 98 - 111 mmol/L   CO2 24 22 - 32 mmol/L   Glucose, Bld 267 (H) 70 - 99 mg/dL    Comment: Glucose reference range applies only to samples taken after fasting for at least 8 hours.   BUN 9 6 - 20 mg/dL   Creatinine, Ser 7.91 0.44 - 1.00 mg/dL   Calcium 8.3 (L) 8.9 - 10.3 mg/dL   GFR, Estimated >50 >56 mL/min    Comment: (NOTE) Calculated using the CKD-EPI Creatinine Equation (2021)    Anion gap 6 5 - 15    Comment: Performed at [facility], [address], [place], [place] [address]  Magnesium     Status: None   Collection Time: 06/01/21  7:58 PM  Result Value Ref Range   Magnesium 2.2 1.7 - 2.4 mg/dL    Comment: Performed at [facility], [address], [place], [place] [address]  Phosphorus     Status: None   Collection Time: 06/01/21  7:58 PM  Result Value Ref Range   Phosphorus 2.7 2.5 - 4.6 mg/dL    Comment: Performed at [facility], [address] [place], [place] [address]  Blood gas, venous     Status: Abnormal   Collection Time: 06/01/21  7:58 PM  Result Value Ref Range   pH, Ven 7.40 7.250 - 7.430   pCO2, Ven 40 (L) 44.0 - 60.0 mmHg   pO2, Ven 81.0 (H) 32.0 - 45.0 mmHg   Bicarbonate 24.8 20.0 - 28.0 mmol/L   Acid-Base Excess 0.0 0.0 - 2.0  mmol/L   O2 Saturation 95.9 %   Patient temperature 37.0    Collection site VEIN    Sample type VENOUS     Comment: Performed at [facility], [address] [place], [place] [address]  Glucose, capillary     Status: Abnormal   Collection Time: 06/01/21  8:19 PM  Result Value Ref Range   Glucose-Capillary 131 (H) 70 - 99 mg/dL    Comment: Glucose reference range applies only to samples taken after fasting for at least 8 hours.  Glucose, capillary     Status: Abnormal   Collection Time: 06/01/21  9:23 PM  Result Value Ref Range   Glucose-Capillary 66 (L) 70 - 99 mg/dL    Comment: Glucose reference range applies only to samples taken after fasting for at least 8 hours.   Comment 1 Notify RN    Comment 2 Document in Chart   Glucose, capillary     Status: Abnormal   Collection Time: 06/01/21  9:40 PM  Result Value Ref Range   Glucose-Capillary 132 (H) 70 - 99 mg/dL    Comment: Glucose reference range applies only to samples taken after fasting for at least 8 hours.   Comment 1 Notify RN    Comment 2 Document in Chart   Glucose, capillary     Status: Abnormal   Collection Time: 06/01/21 10:39 PM  Result Value Ref Range   Glucose-Capillary 66 (L) 70 - 99 mg/dL    Comment: Glucose reference range applies only to samples taken after fasting for at least 8 hours.   Comment 1 Notify RN    Comment 2 Document in Chart   Glucose, capillary  Status: Abnormal   Collection Time: 06/01/21 10:56 PM  Result Value Ref Range   Glucose-Capillary 107 (H) 70 - 99 mg/dL    Comment: Glucose reference range applies only to samples taken after fasting for at least 8 hours.   Comment 1 Notify RN    Comment 2 Document in Chart   Glucose, capillary     Status: Abnormal   Collection Time: 06/01/21 11:56 PM  Result Value Ref Range   Glucose-Capillary 65 (L) 70 - 99 mg/dL    Comment: Glucose reference range applies only to samples taken after fasting for at least 8 hours.  Glucose, capillary      Status: Abnormal   Collection Time: 06/02/21 12:16 AM  Result Value Ref Range   Glucose-Capillary 119 (H) 70 - 99 mg/dL    Comment: Glucose reference range applies only to samples taken after fasting for at least 8 hours.  Glucose, capillary     Status: Abnormal   Collection Time: 06/02/21  1:16 AM  Result Value Ref Range   Glucose-Capillary 59 (L) 70 - 99 mg/dL    Comment: Glucose reference range applies only to samples taken after fasting for at least 8 hours.   Comment 1 Notify RN    Comment 2 Document in Chart   Glucose, capillary     Status: Abnormal   Collection Time: 06/02/21  1:39 AM  Result Value Ref Range   Glucose-Capillary 107 (H) 70 - 99 mg/dL    Comment: Glucose reference range applies only to samples taken after fasting for at least 8 hours.   Comment 1 Notify RN    Comment 2 Document in Chart   Procalcitonin     Status: None   Collection Time: 06/02/21  2:00 AM  Result Value Ref Range   Procalcitonin <0.10 ng/mL    Comment:        Interpretation: PCT (Procalcitonin) <= 0.5 ng/mL: Systemic infection (sepsis) is not likely. Local bacterial infection is possible. (NOTE)       Sepsis PCT Algorithm           Lower Respiratory Tract                                      Infection PCT Algorithm    ----------------------------     ----------------------------         PCT < 0.25 ng/mL                PCT < 0.10 ng/mL          Strongly encourage             Strongly discourage   discontinuation of antibiotics    initiation of antibiotics    ----------------------------     -----------------------------       PCT 0.25 - 0.50 ng/mL            PCT 0.10 - 0.25 ng/mL               OR       >80% decrease in PCT            Discourage initiation of                                            antibiotics  Encourage discontinuation           of antibiotics    ----------------------------     -----------------------------         PCT >= 0.50 ng/mL              PCT 0.26 -  0.50 ng/mL               AND        <80% decrease in PCT             Encourage initiation of                                             antibiotics       Encourage continuation           of antibiotics    ----------------------------     -----------------------------        PCT >= 0.50 ng/mL                  PCT > 0.50 ng/mL               AND         increase in PCT                  Strongly encourage                                      initiation of antibiotics    Strongly encourage escalation           of antibiotics                                     -----------------------------                                           PCT <= 0.25 ng/mL                                                 OR                                        > 80% decrease in PCT                                      Discontinue / Do not initiate                                             antibiotics  Performed at [facility], [address], [place], [place] [address]   Basic metabolic panel     Status: Abnormal   Collection Time: 06/02/21  2:00 AM  Result Value Ref Range   Sodium 137 135 - 145 mmol/L   Potassium 4.1 3.5 - 5.1 mmol/L   Chloride 110 98 - 111 mmol/L   CO2 24 22 - 32 mmol/L   Glucose, Bld 84 70 - 99 mg/dL    Comment: Glucose reference range applies only to samples taken after fasting for at least 8 hours.   BUN 8 6 - 20 mg/dL   Creatinine, Ser 1.300.64 0.44 - 1.00 mg/dL   Calcium 8.3 (L) 8.9 - 10.3 mg/dL   GFR, Estimated >86>60 >57>60 mL/min    Comment: (NOTE) Calculated using the CKD-EPI Creatinine Equation (2021)    Anion gap 3 (L) 5 - 15    Comment: Performed at [facility], [address] [place], [place] [address]  CBC     Status: Abnormal   Collection Time: 06/02/21  2:00 AM  Result Value Ref Range   WBC 9.7 4.0 - 10.5 K/uL   RBC 4.81 3.87 - 5.11 MIL/uL   Hemoglobin 11.9 (L) 12.0 - 15.0 g/dL   HCT 29.537.5 28.436.0 - 13.246.0 %   MCV 78.0 (L) 80.0 - 100.0 fL   MCH 24.7  (L) 26.0 - 34.0 pg   MCHC 31.7 30.0 - 36.0 g/dL   RDW 44.016.9 (H) 10.211.5 - 72.515.5 %   Platelets 274 150 - 400 K/uL   nRBC 0.0 0.0 - 0.2 %    Comment: Performed at [facility], [address] [place], [place] [address]  Magnesium     Status: None   Collection Time: 06/02/21  2:00 AM  Result Value Ref Range   Magnesium 2.0 1.7 - 2.4 mg/dL    Comment: Performed at [facility], [address] [place], [place] [address]  Phosphorus     Status: Abnormal   Collection Time: 06/02/21  2:00 AM  Result Value Ref Range   Phosphorus 1.9 (L) 2.5 - 4.6 mg/dL    Comment: Performed at [facility], [address] [place], [place] [address]  Glucose, capillary     Status: None   Collection Time: 06/02/21  2:03 AM  Result Value Ref Range   Glucose-Capillary 89 70 - 99 mg/dL    Comment: Glucose reference range applies only to samples taken after fasting for at least 8 hours.  Glucose, capillary     Status: Abnormal   Collection Time: 06/02/21  2:43 AM  Result Value Ref Range   Glucose-Capillary 68 (L) 70 - 99 mg/dL    Comment: Glucose reference range applies only to samples taken after fasting for at least 8 hours.   Comment 1 Notify RN    Comment 2 Document in Chart   Glucose, capillary     Status: Abnormal   Collection Time: 06/02/21  3:15 AM  Result Value Ref Range   Glucose-Capillary 121 (H) 70 - 99 mg/dL    Comment: Glucose reference range applies only to samples taken after fasting for at least 8 hours.   Comment 1 Notify RN    Comment 2 Document in Chart   Glucose, capillary     Status: None   Collection Time: 06/02/21  4:19 AM  Result Value Ref Range   Glucose-Capillary 74 70 - 99 mg/dL    Comment: Glucose reference range applies only to samples taken after fasting for at least 8 hours.   Comment 1 Notify RN    Comment 2 Document in Chart   Glucose, capillary     Status: None  Collection Time: 06/02/21  5:20 AM  Result Value Ref Range   Glucose-Capillary  91 70 - 99 mg/dL    Comment: Glucose reference range applies only to samples taken after fasting for at least 8 hours.   Comment 1 Notify RN    Comment 2 Document in Chart   Glucose, capillary     Status: None   Collection Time: 06/02/21  6:20 AM  Result Value Ref Range   Glucose-Capillary 96 70 - 99 mg/dL    Comment: Glucose reference range applies only to samples taken after fasting for at least 8 hours.   Comment 1 Notify RN    Comment 2 Document in Chart   Glucose, capillary     Status: None   Collection Time: 06/02/21  7:35 AM  Result Value Ref Range   Glucose-Capillary 82 70 - 99 mg/dL    Comment: Glucose reference range applies only to samples taken after fasting for at least 8 hours.  Glucose, capillary     Status: Abnormal   Collection Time: 06/02/21  8:31 AM  Result Value Ref Range   Glucose-Capillary 100 (H) 70 - 99 mg/dL    Comment: Glucose reference range applies only to samples taken after fasting for at least 8 hours.  Glucose, capillary     Status: None   Collection Time: 06/02/21  9:27 AM  Result Value Ref Range   Glucose-Capillary 87 70 - 99 mg/dL    Comment: Glucose reference range applies only to samples taken after fasting for at least 8 hours.  Glucose, capillary     Status: None   Collection Time: 06/02/21 10:32 AM  Result Value Ref Range   Glucose-Capillary 94 70 - 99 mg/dL    Comment: Glucose reference range applies only to samples taken after fasting for at least 8 hours.  Glucose, capillary     Status: Abnormal   Collection Time: 06/02/21 10:59 AM  Result Value Ref Range   Glucose-Capillary 64 (L) 70 - 99 mg/dL    Comment: Glucose reference range applies only to samples taken after fasting for at least 8 hours.  Glucose, capillary     Status: Abnormal   Collection Time: 06/02/21 11:14 AM  Result Value Ref Range   Glucose-Capillary 190 (H) 70 - 99 mg/dL    Comment: Glucose reference range applies only to samples taken after fasting for at least 8  hours.  Glucose, capillary     Status: Abnormal   Collection Time: 06/02/21 12:16 PM  Result Value Ref Range   Glucose-Capillary 124 (H) 70 - 99 mg/dL    Comment: Glucose reference range applies only to samples taken after fasting for at least 8 hours.  Glucose, capillary     Status: None   Collection Time: 06/02/21  1:17 PM  Result Value Ref Range   Glucose-Capillary 94 70 - 99 mg/dL    Comment: Glucose reference range applies only to samples taken after fasting for at least 8 hours.  Glucose, capillary     Status: Abnormal   Collection Time: 06/02/21  2:12 PM  Result Value Ref Range   Glucose-Capillary 48 (L) 70 - 99 mg/dL    Comment: Glucose reference range applies only to samples taken after fasting for at least 8 hours.   Comment 1 Notify RN   Glucose, capillary     Status: Abnormal   Collection Time: 06/02/21  3:12 PM  Result Value Ref Range   Glucose-Capillary 132 (H) 70 - 99 mg/dL  Comment: Glucose reference range applies only to samples taken after fasting for at least 8 hours.  Basic metabolic panel     Status: Abnormal   Collection Time: 06/02/21  3:30 PM  Result Value Ref Range   Sodium 138 135 - 145 mmol/L   Potassium 3.9 3.5 - 5.1 mmol/L   Chloride 112 (H) 98 - 111 mmol/L   CO2 20 (L) 22 - 32 mmol/L   Glucose, Bld 116 (H) 70 - 99 mg/dL    Comment: Glucose reference range applies only to samples taken after fasting for at least 8 hours.   BUN 6 6 - 20 mg/dL   Creatinine, Ser 1.61 0.44 - 1.00 mg/dL   Calcium 8.4 (L) 8.9 - 10.3 mg/dL   GFR, Estimated >09 >60 mL/min    Comment: (NOTE) Calculated using the CKD-EPI Creatinine Equation (2021)    Anion gap 6 5 - 15    Comment: Performed at [facility], [address] [place], [place] [address]  Glucose, capillary     Status: Abnormal   Collection Time: 06/02/21  4:14 PM  Result Value Ref Range   Glucose-Capillary 110 (H) 70 - 99 mg/dL    Comment: Glucose reference range applies only to samples taken  after fasting for at least 8 hours.  Glucose, capillary     Status: Abnormal   Collection Time: 06/02/21  5:13 PM  Result Value Ref Range   Glucose-Capillary 112 (H) 70 - 99 mg/dL    Comment: Glucose reference range applies only to samples taken after fasting for at least 8 hours.  Glucose, capillary     Status: Abnormal   Collection Time: 06/02/21  6:15 PM  Result Value Ref Range   Glucose-Capillary 109 (H) 70 - 99 mg/dL    Comment: Glucose reference range applies only to samples taken after fasting for at least 8 hours.  Glucose, capillary     Status: None   Collection Time: 06/02/21  7:14 PM  Result Value Ref Range   Glucose-Capillary 92 70 - 99 mg/dL    Comment: Glucose reference range applies only to samples taken after fasting for at least 8 hours.  Glucose, capillary     Status: Abnormal   Collection Time: 06/02/21  8:27 PM  Result Value Ref Range   Glucose-Capillary 17 (LL) 70 - 99 mg/dL    Comment: Glucose reference range applies only to samples taken after fasting for at least 8 hours.   Comment 1 Notify RN   Glucose, capillary     Status: Abnormal   Collection Time: 06/02/21  8:53 PM  Result Value Ref Range   Glucose-Capillary 163 (H) 70 - 99 mg/dL    Comment: Glucose reference range applies only to samples taken after fasting for at least 8 hours.  Glucose, capillary     Status: Abnormal   Collection Time: 06/02/21  9:26 PM  Result Value Ref Range   Glucose-Capillary 129 (H) 70 - 99 mg/dL    Comment: Glucose reference range applies only to samples taken after fasting for at least 8 hours.  Glucose, capillary     Status: Abnormal   Collection Time: 06/02/21 10:11 PM  Result Value Ref Range   Glucose-Capillary 119 (H) 70 - 99 mg/dL    Comment: Glucose reference range applies only to samples taken after fasting for at least 8 hours.  Basic metabolic panel     Status: Abnormal   Collection Time: 06/02/21 10:33 PM  Result Value Ref Range  Sodium 136 135 - 145 mmol/L    Potassium 3.4 (L) 3.5 - 5.1 mmol/L   Chloride 107 98 - 111 mmol/L   CO2 23 22 - 32 mmol/L   Glucose, Bld 114 (H) 70 - 99 mg/dL    Comment: Glucose reference range applies only to samples taken after fasting for at least 8 hours.   BUN <5 (L) 6 - 20 mg/dL   Creatinine, Ser 6.04 0.44 - 1.00 mg/dL   Calcium 8.5 (L) 8.9 - 10.3 mg/dL   GFR, Estimated >54 >09 mL/min    Comment: (NOTE) Calculated using the CKD-EPI Creatinine Equation (2021)    Anion gap 6 5 - 15    Comment: Performed at [facility], [address] [place], [place] [address]  Glucose, capillary     Status: Abnormal   Collection Time: 06/02/21 11:08 PM  Result Value Ref Range   Glucose-Capillary 123 (H) 70 - 99 mg/dL    Comment: Glucose reference range applies only to samples taken after fasting for at least 8 hours.  Glucose, capillary     Status: Abnormal   Collection Time: 06/03/21 12:00 AM  Result Value Ref Range   Glucose-Capillary 143 (H) 70 - 99 mg/dL    Comment: Glucose reference range applies only to samples taken after fasting for at least 8 hours.  Glucose, capillary     Status: Abnormal   Collection Time: 06/03/21  1:01 AM  Result Value Ref Range   Glucose-Capillary 130 (H) 70 - 99 mg/dL    Comment: Glucose reference range applies only to samples taken after fasting for at least 8 hours.  Glucose, capillary     Status: Abnormal   Collection Time: 06/03/21  3:07 AM  Result Value Ref Range   Glucose-Capillary 109 (H) 70 - 99 mg/dL    Comment: Glucose reference range applies only to samples taken after fasting for at least 8 hours.  CBC with Differential/Platelet     Status: Abnormal   Collection Time: 06/03/21  6:00 AM  Result Value Ref Range   WBC 9.1 4.0 - 10.5 K/uL   RBC 5.13 (H) 3.87 - 5.11 MIL/uL   Hemoglobin 13.0 12.0 - 15.0 g/dL   HCT 47.8 29.5 - 62.1 %   MCV 79.3 (L) 80.0 - 100.0 fL   MCH 25.3 (L) 26.0 - 34.0 pg   MCHC 31.9 30.0 - 36.0 g/dL   RDW 30.8 (H) 65.7 - 84.6 %   Platelets 272  150 - 400 K/uL   nRBC 0.0 0.0 - 0.2 %   Neutrophils Relative % 65 %   Neutro Abs 5.9 1.7 - 7.7 K/uL   Lymphocytes Relative 23 %   Lymphs Abs 2.1 0.7 - 4.0 K/uL   Monocytes Relative 11 %   Monocytes Absolute 1.0 0.1 - 1.0 K/uL   Eosinophils Relative 0 %   Eosinophils Absolute 0.0 0.0 - 0.5 K/uL   Basophils Relative 1 %   Basophils Absolute 0.1 0.0 - 0.1 K/uL   Immature Granulocytes 0 %   Abs Immature Granulocytes 0.04 0.00 - 0.07 K/uL    Comment: Performed at [facility], [address] [place], [place] [address]  Magnesium     Status: None   Collection Time: 06/03/21  6:00 AM  Result Value Ref Range   Magnesium 1.7 1.7 - 2.4 mg/dL    Comment: Performed at [facility], [address], [place], [place] [id number]  Phosphorus     Status: None  Collection Time: 06/03/21  6:00 AM  Result Value Ref Range   Phosphorus 2.8 2.5 - 4.6 mg/dL    Comment: Performed at [facility], [address] [place], [place] [address]  Basic metabolic panel     Status: Abnormal   Collection Time: 06/03/21  6:00 AM  Result Value Ref Range   Sodium 139 135 - 145 mmol/L   Potassium 3.5 3.5 - 5.1 mmol/L   Chloride 106 98 - 111 mmol/L   CO2 25 22 - 32 mmol/L   Glucose, Bld 87 70 - 99 mg/dL    Comment: Glucose reference range applies only to samples taken after fasting for at least 8 hours.   BUN 5 (L) 6 - 20 mg/dL   Creatinine, Ser 6.04 0.44 - 1.00 mg/dL   Calcium 8.3 (L) 8.9 - 10.3 mg/dL   GFR, Estimated >54 >09 mL/min    Comment: (NOTE) Calculated using the CKD-EPI Creatinine Equation (2021)    Anion gap 8 5 - 15    Comment: Performed at [facility], [address] [place], [place] [address]  Glucose, capillary     Status: None   Collection Time: 06/03/21  6:05 AM  Result Value Ref Range   Glucose-Capillary 86 70 - 99 mg/dL    Comment: Glucose reference range applies only to samples taken after fasting for at least 8 hours.  Glucose, capillary      Status: Abnormal   Collection Time: 06/03/21  7:50 AM  Result Value Ref Range   Glucose-Capillary 66 (L) 70 - 99 mg/dL    Comment: Glucose reference range applies only to samples taken after fasting for at least 8 hours.  Glucose, capillary     Status: Abnormal   Collection Time: 06/03/21  8:30 AM  Result Value Ref Range   Glucose-Capillary 138 (H) 70 - 99 mg/dL    Comment: Glucose reference range applies only to samples taken after fasting for at least 8 hours.  Glucose, capillary     Status: Abnormal   Collection Time: 06/03/21  9:28 AM  Result Value Ref Range   Glucose-Capillary 165 (H) 70 - 99 mg/dL    Comment: Glucose reference range applies only to samples taken after fasting for at least 8 hours.  Glucose, capillary     Status: Abnormal   Collection Time: 06/03/21 10:26 AM  Result Value Ref Range   Glucose-Capillary 177 (H) 70 - 99 mg/dL    Comment: Glucose reference range applies only to samples taken after fasting for at least 8 hours.  Glucose, capillary     Status: Abnormal   Collection Time: 06/03/21 11:28 AM  Result Value Ref Range   Glucose-Capillary 145 (H) 70 - 99 mg/dL    Comment: Glucose reference range applies only to samples taken after fasting for at least 8 hours.  Glucose, capillary     Status: Abnormal   Collection Time: 06/03/21 12:22 PM  Result Value Ref Range   Glucose-Capillary 107 (H) 70 - 99 mg/dL    Comment: Glucose reference range applies only to samples taken after fasting for at least 8 hours.  Glucose, capillary     Status: Abnormal   Collection Time: 06/03/21  1:28 PM  Result Value Ref Range   Glucose-Capillary 100 (H) 70 - 99 mg/dL    Comment: Glucose reference range applies only to samples taken after fasting for at least 8 hours.  Basic metabolic panel     Status: Abnormal   Collection Time:  06/03/21  2:28 PM  Result Value Ref Range   Sodium 137 135 - 145 mmol/L   Potassium 4.1 3.5 - 5.1 mmol/L   Chloride 107 98 - 111 mmol/L   CO2 23 22  - 32 mmol/L   Glucose, Bld 83 70 - 99 mg/dL    Comment: Glucose reference range applies only to samples taken after fasting for at least 8 hours.   BUN <5 (L) 6 - 20 mg/dL   Creatinine, Ser 1.96 0.44 - 1.00 mg/dL   Calcium 8.8 (L) 8.9 - 10.3 mg/dL   GFR, Estimated >22 >29 mL/min    Comment: (NOTE) Calculated using the CKD-EPI Creatinine Equation (2021)    Anion gap 7 5 - 15    Comment: Performed at [facility], [address] [place], [place] [address]  Glucose, capillary     Status: None   Collection Time: 06/03/21  2:30 PM  Result Value Ref Range   Glucose-Capillary 87 70 - 99 mg/dL    Comment: Glucose reference range applies only to samples taken after fasting for at least 8 hours.  Glucose, capillary     Status: None   Collection Time: 06/03/21  3:25 PM  Result Value Ref Range   Glucose-Capillary 75 70 - 99 mg/dL    Comment: Glucose reference range applies only to samples taken after fasting for at least 8 hours.    Current Facility-Administered Medications  Medication Dose Route Frequency Provider Last Rate Last Admin   acetaminophen (TYLENOL) tablet 650 mg  650 mg Oral Q4H PRN Erin Fulling, MD   650 mg at 06/01/21 1651   amLODipine (NORVASC) tablet 5 mg  5 mg Oral Daily Marrion Coy, MD   5 mg at 06/03/21 0929   carvedilol (COREG) tablet 12.5 mg  12.5 mg Oral BID WC Manuela Schwartz, NP   12.5 mg at 06/03/21 1194   chlorhexidine (PERIDEX) 0.12 % solution 15 mL  15 mL Mouth Rinse BID Erin Fulling, MD   15 mL at 06/02/21 2033   Chlorhexidine Gluconate Cloth 2 % PADS 6 each  6 each Topical [id number] Erin Fulling, MD   6 each at 06/03/21 0610   dextrose 10 % and 0.45 % NaCl 1,000 mL with potassium chloride 20 mEq infusion   Intravenous Continuous Lowella Bandy, RPH       dextrose 50 % solution 25-50 mL  0.5-1 ampule Intravenous Q1H PRN Rust-Chester, Britton L, NP   25 mL at 06/03/21 0803   docusate sodium (COLACE) capsule 100 mg  100 mg Oral BID PRN Erin Fulling, MD        enoxaparin (LOVENOX) injection 40 mg  40 mg Subcutaneous Q24H Erin Fulling, MD   40 mg at 06/03/21 1448   furosemide (LASIX) tablet 40 mg  40 mg Oral Daily Marrion Coy, MD   40 mg at 06/03/21 1856   hydrocortisone sodium succinate (SOLU-CORTEF) 100 MG injection 50 mg  50 mg Intravenous Q8H Jimmye Norman, NP   50 mg at 06/03/21 1409   MEDLINE mouth rinse  15 mL Mouth Rinse q12n4p Erin Fulling, MD       mupirocin ointment (BACTROBAN) 2 % 1 application  1 application Nasal BID Marrion Coy, MD       octreotide (SANDOSTATIN) 500 mcg in sodium chloride 0.9 % 250 mL (2 mcg/mL) infusion  50 mcg/hr Intravenous Continuous Marrion Coy, MD 25 mL/hr at 06/03/21 0419 50 mcg/hr at 06/03/21 0419   ondansetron (ZOFRAN) injection 4 mg  4 mg Intravenous Q6H PRN Erin Fulling, MD   4 mg at 06/02/21 2034   pantoprazole (PROTONIX) EC tablet 40 mg  40 mg Oral QHS Lowella Bandy, RPH       polyethylene glycol (MIRALAX / GLYCOLAX) packet 17 g  17 g Oral Daily PRN Erin Fulling, MD       sertraline (ZOLOFT) tablet 100 mg  100 mg Oral Daily Lowella Bandy, RPH   100 mg at 06/03/21 1610    Musculoskeletal: Strength & Muscle Tone: within normal limits Gait & Station:  Not assessed Patient leans: N/A            Psychiatric Specialty Exam:  Presentation  General Appearance: Disheveled  Eye Contact:Fair  Speech:Normal Rate  Speech Volume:Decreased  Handedness:Right   Mood and Affect  Mood:Hopeless; Depressed; Anxious  Affect:Tearful   Thought Process  Thought Processes:Coherent  Descriptions of Associations:Intact  Orientation:Full (Time, Place and Person)  Thought Content:Rumination  History of Schizophrenia/Schizoaffective disorder:No data recorded Duration of Psychotic Symptoms:No data recorded Hallucinations:Hallucinations: None  Ideas of Reference:None  Suicidal Thoughts:Suicidal Thoughts: Yes, Passive  Homicidal Thoughts:Homicidal Thoughts: No   Sensorium   Memory:Immediate Fair; Recent Fair; Remote Fair  Judgment:Intact  Insight:Present   Executive Functions  Concentration:Fair  Attention Span:Fair  Recall:Fair  Fund of Knowledge:Fair  Language:Fair   Psychomotor Activity  Psychomotor Activity:Psychomotor Activity: Decreased   Assets  Assets:Desire for Improvement; Financial Resources/Insurance; Housing; Intimacy; Resilience; Social Support   Sleep  Sleep:Sleep: Poor   Physical Exam: Physical Exam Vitals and nursing note reviewed.  Constitutional:      Appearance: She is ill-appearing.  HENT:     Head: Normocephalic and atraumatic.     Right Ear: External ear normal.     Left Ear: External ear normal.     Nose: Nose normal.     Mouth/Throat:     Mouth: Mucous membranes are moist.     Pharynx: Oropharynx is clear.  Eyes:     Extraocular Movements: Extraocular movements intact.     Conjunctiva/sclera: Conjunctivae normal.     Pupils: Pupils are equal, round, and reactive to light.  Cardiovascular:     Rate and Rhythm: Normal rate.     Pulses: Normal pulses.  Pulmonary:     Effort: Pulmonary effort is normal.     Breath sounds: Normal breath sounds.  Abdominal:     General: Abdomen is flat.     Palpations: Abdomen is soft.  Musculoskeletal:        General: No swelling. Normal range of motion.     Cervical back: Normal range of motion and neck supple.  Skin:    General: Skin is warm and dry.  Neurological:     General: No focal deficit present.     Mental Status: She is alert and oriented to person, place, and time.  Psychiatric:        Attention and Perception: Attention and perception normal.        Mood and Affect: Mood is depressed.        Behavior: Behavior is withdrawn. Behavior is cooperative.        Thought Content: Thought content includes suicidal ideation. Thought content does not include suicidal plan.        Cognition and Memory: Cognition and memory normal.        Judgment: Judgment is  impulsive.   Review of Systems  Constitutional: Negative.   HENT: Negative.    Eyes: Negative.   Respiratory: Negative.  Cardiovascular: Negative.   Gastrointestinal: Negative.   Genitourinary: Negative.   Musculoskeletal: Negative.   Skin: Negative.   Neurological: Negative.   Endo/Heme/Allergies:  Positive for environmental allergies. Does not bruise/bleed easily.  Psychiatric/Behavioral:  Positive for depression and suicidal ideas. Negative for hallucinations and substance abuse. The patient has insomnia. The patient is not nervous/anxious.   Blood pressure 107/68, pulse 90, temperature 98.4 F (36.9 C), resp. rate 14, height 5\' 7"  (1.702 m), weight 74.1 kg, SpO2 94 %. Body mass index is 25.59 kg/m.  Treatment Plan Summary: Daily contact with patient to assess and evaluate symptoms and progress in treatment and Medication management 55 year old female presenting after suicide attempt via insulin injection. Agree with bedside sitter, and resuming sertraline 100 mg daily.   Disposition: Recommend psychiatric Inpatient admission when medically cleared. Supportive therapy provided about ongoing stressors. Psychiatry will continue to follow along while on the medical floor.   Jesse Sans, MD 06/03/2021 4:04 PM

## 2021-06-04 DIAGNOSIS — F332 Major depressive disorder, recurrent severe without psychotic features: Secondary | ICD-10-CM

## 2021-06-04 DIAGNOSIS — F4312 Post-traumatic stress disorder, chronic: Secondary | ICD-10-CM

## 2021-06-04 LAB — GLUCOSE, CAPILLARY
Glucose-Capillary: 111 mg/dL — ABNORMAL HIGH (ref 70–99)
Glucose-Capillary: 117 mg/dL — ABNORMAL HIGH (ref 70–99)
Glucose-Capillary: 124 mg/dL — ABNORMAL HIGH (ref 70–99)
Glucose-Capillary: 126 mg/dL — ABNORMAL HIGH (ref 70–99)
Glucose-Capillary: 128 mg/dL — ABNORMAL HIGH (ref 70–99)
Glucose-Capillary: 130 mg/dL — ABNORMAL HIGH (ref 70–99)
Glucose-Capillary: 135 mg/dL — ABNORMAL HIGH (ref 70–99)
Glucose-Capillary: 146 mg/dL — ABNORMAL HIGH (ref 70–99)
Glucose-Capillary: 150 mg/dL — ABNORMAL HIGH (ref 70–99)
Glucose-Capillary: 150 mg/dL — ABNORMAL HIGH (ref 70–99)

## 2021-06-04 LAB — CULTURE, BLOOD (SINGLE)

## 2021-06-04 LAB — MAGNESIUM: Magnesium: 1.9 mg/dL (ref 1.7–2.4)

## 2021-06-04 LAB — POTASSIUM: Potassium: 3.7 mmol/L (ref 3.5–5.1)

## 2021-06-04 NOTE — Progress Notes (Signed)
Patient vitals stable. Blood glucose checks every 4 hours per MD. Patient complains of headache, treated with tylenol. Safety sitter at bedside. IVC restrictions policy reviewed with husband.

## 2021-06-04 NOTE — Plan of Care (Signed)

## 2021-06-04 NOTE — Progress Notes (Signed)
Took over care of patient from Rose, RN @ 0200 

## 2021-06-04 NOTE — Progress Notes (Signed)
PROGRESS NOTE    Katie [name]  PEJ:[id number] DOB: Dec 02, 1965 DOA: 06/01/2021 PCP: [facility]   Chief complaint.  Altered mental status. Brief Narrative:  Katie [name] is a 55 year old female with history of nonischemic cardiomyopathy, obstructive sleep apnea, substance abuse, essential hypertension, coronary disease who present to the hospital with intentional insulin injection, hypoglycemia and altered mental status. Upon arriving the hospital, her potassium with 2.6, severe hypoglycemia.  She was admitted to the intensive care stepdown unit, was given D10 infusion, Solu-Cortef, octreotide. 8/31-family at bedside. Pt reports decrease po intake and not much appetite. No other overnight issue.      Assessment & Plan:   Active Problems:   Suicide attempt (HCC)   Hypoglycemia due to insulin   Hypokalemia   MDD (major depressive disorder), recurrent severe, without psychosis (HCC)   Chronic post-traumatic stress disorder (PTSD)  #1.  Severe hypoglycemia secondary to intentional insulin overdose. Insulin overdose with suicide attempt. Acute toxic encephalopathy secondary to severe hypoglycemia. Seizure secondary to hypoglycemia. Patient still has significant hypoglycemia earlier this morning. She is continued on Solu-Cortef and octreotide.  She was continued on D10 at 200 mL/h. Currently glucose is better, reduce D10 to 100 mL/h.  Gradually wean off.  It is also eating.  Her mental status gradually improving, but still disoriented to time. Will need to reassess her mental status after fully improved. Patient need to be transferred to mental health when condition stable. 8/31 psych input was appreciated-rec. Inpt psych admission when medically cleared.  Continue ivf and steroid as pt not eating much and BG low 100.  Encouraged pt to increase po intake so we can wean off ivf .     #2.  Hypokalemia. Hypophosphatemia Improved, stable.    #3.  Chronic  diastolic congestive heart failure secondary to nonischemic cardiomyopathy. Coronary artery disease. Essential hypertension. Continue norvasc and lasix Current without acute decompensation asx    #4.  Suicide attempt. Anxiety depression  PTSD. 8/31 psych input appreciated plan to transfer to inpt psych when medically stable. Continue 1:1 sit     DVT prophylaxis: Lovenox Code Status: full Family Communication:  Disposition Plan:    Status is: Inpatient  Remains inpatient appropriate because:Altered mental status, IV treatments appropriate due to intensity of illness or inability to take PO, and Inpatient level of care appropriate due to severity of illness  Dispo: The patient is from: Home              Anticipated d/c is to: Home              Patient currently is not medically stable to d/c.   Difficult to place patient No        I/O last 3 completed shifts: In: 2409.3 [P.O.:480; I.V.:1929.3] Out: 7525 [Urine:7525] Total I/O In: 100 [P.O.:100] Out: 1175 [Urine:1175]     Consultants:  Psych  Procedures: None  Antimicrobials: None Subjective: Talks in soft voice. No complaints  Objective: Vitals:   06/04/21 1000 06/04/21 1100 06/04/21 1200 06/04/21 1300  BP: (!) 158/104 139/70 (!) 131/92 (!) 148/88  Pulse: 94 91 88 86  Resp:      Temp:   98 F (36.7 C)   TempSrc:   Oral   SpO2: 98% 95% 97% 97%  Weight:      Height:        Intake/Output Summary (Last 24 hours) at 06/04/2021 1556 Last data filed at 06/04/2021 1421 Gross per 24 hour  Intake 1823.71 ml  Output 2775 ml  Net -951.29 ml   Filed Weights   06/02/21 0549 06/03/21 0500 06/04/21 0500  Weight: 76.7 kg 74.1 kg 74 kg    Examination: Calm, nad Cta no w/r Regular s1/s2 no gallop Soft benign +bs No edema Mood and affect appropriate in current setting    Data Reviewed: I have personally reviewed following labs and imaging studies  CBC: Recent Labs  Lab 06/01/21 0456  06/02/21 0200 06/03/21 0600  WBC 18.0* 9.7 9.1  NEUTROABS 15.9*  --  5.9  HGB 12.4 11.9* 13.0  HCT 38.3 37.5 40.7  MCV 78.2* 78.0* 79.3*  PLT 310 274 272   Basic Metabolic Panel: Recent Labs  Lab 06/01/21 1050 06/01/21 1052 06/01/21 1958 06/02/21 0200 06/02/21 1530 06/02/21 2233 06/03/21 0600 06/03/21 1428 06/03/21 2231 06/04/21 0629 06/04/21 1036  NA  --   --  137 137 138 136 139 137 136  --   --   K  --    < > 4.0 4.1 3.9 3.4* 3.5 4.1 3.9  --  3.7  CL  --   --  107 110 112* 107 106 107 104  --   --   CO2  --   --  24 24 20* 23 25 23 24   --   --   GLUCOSE  --   --  267* 84 116* 114* 87 83 143*  --   --   BUN  --   --  9 8 6  <5* 5* <5* 5*  --   --   CREATININE  --   --  0.81 0.64 0.66 0.68 0.74 0.70 0.80  --   --   CALCIUM  --   --  8.3* 8.3* 8.4* 8.5* 8.3* 8.8* 8.7*  --   --   MG 1.6*  --  2.2 2.0  --   --  1.7  --   --  1.9  --   PHOS  --   --  2.7 1.9*  --   --  2.8  --   --   --   --    < > = values in this interval not displayed.   GFR: Estimated Creatinine Clearance: 83.5 mL/min (by C-G formula based on SCr of 0.8 mg/dL). Liver Function Tests: Recent Labs  Lab 06/01/21 0456  AST 22  ALT 13  ALKPHOS 56  BILITOT 0.6  PROT 8.0  ALBUMIN 4.0   No results for input(s): LIPASE, AMYLASE in the last 168 hours. Recent Labs  Lab 06/01/21 0539  AMMONIA 14   Coagulation Profile: Recent Labs  Lab 06/01/21 0539  INR 1.1   Cardiac Enzymes: No results for input(s): CKTOTAL, CKMB, CKMBINDEX, TROPONINI in the last 168 hours. BNP (last 3 results) No results for input(s): PROBNP in the last 8760 hours. HbA1C: No results for input(s): HGBA1C in the last 72 hours. CBG: Recent Labs  Lab 06/04/21 0424 06/04/21 0629 06/04/21 0823 06/04/21 1027 06/04/21 1221  GLUCAP 126* 111* 117* 130* 135*   Lipid Profile: No results for input(s): CHOL, HDL, LDLCALC, TRIG, CHOLHDL, LDLDIRECT in the last 72 hours. Thyroid Function Tests: No results for input(s): TSH,  T4TOTAL, FREET4, T3FREE, THYROIDAB in the last 72 hours.  Anemia Panel: No results for input(s): VITAMINB12, FOLATE, FERRITIN, TIBC, IRON, RETICCTPCT in the last 72 hours. Sepsis Labs: Recent Labs  Lab 06/01/21 0456 06/01/21 1052 06/02/21 0200  PROCALCITON  --  <0.10 <0.10  LATICACIDVEN 0.9 1.7  --  Recent Results (from the past 240 hour(s))  Resp Panel by RT-PCR (Flu A&B, Covid) Nasopharyngeal Swab     Status: None   Collection Time: 06/01/21  4:54 AM   Specimen: Nasopharyngeal Swab; Nasopharyngeal(NP) swabs in vial transport medium  Result Value Ref Range Status   SARS Coronavirus 2 by RT PCR NEGATIVE NEGATIVE Final    Comment: (NOTE) SARS-CoV-2 target nucleic acids are NOT DETECTED.  The SARS-CoV-2 RNA is generally detectable in upper respiratory specimens during the acute phase of infection. The lowest concentration of SARS-CoV-2 viral copies this assay can detect is 138 copies/mL. A negative result does not preclude SARS-Cov-2 infection and should not be used as the sole basis for treatment or other patient management decisions. A negative result may occur with  improper specimen collection/handling, submission of specimen other than nasopharyngeal swab, presence of viral mutation(s) within the areas targeted by this assay, and inadequate number of viral copies(<138 copies/mL). A negative result must be combined with clinical observations, patient history, and epidemiological information. The expected result is Negative.  Fact Sheet for Patients:  BloggerCourse.com  Fact Sheet for Healthcare Providers:  SeriousBroker.it  This test is no t yet approved or cleared by the [place] FDA and  has been authorized for detection and/or diagnosis of SARS-CoV-2 by FDA under an Emergency Use Authorization (EUA). This EUA will remain  in effect (meaning this test can be used) for the duration of the COVID-19 declaration  under Section 564(b)(1) of the Act, 21 U.S.C.section 360bbb-3(b)(1), unless the authorization is terminated  or revoked sooner.       Influenza A by PCR NEGATIVE NEGATIVE Final   Influenza B by PCR NEGATIVE NEGATIVE Final    Comment: (NOTE) The Xpert Xpress SARS-CoV-2/FLU/RSV plus assay is intended as an aid in the diagnosis of influenza from Nasopharyngeal swab specimens and should not be used as a sole basis for treatment. Nasal washings and aspirates are unacceptable for Xpert Xpress SARS-CoV-2/FLU/RSV testing.  Fact Sheet for Patients: BloggerCourse.com  Fact Sheet for Healthcare Providers: SeriousBroker.it  This test is not yet approved or cleared by the [place] FDA and has been authorized for detection and/or diagnosis of SARS-CoV-2 by FDA under an Emergency Use Authorization (EUA). This EUA will remain in effect (meaning this test can be used) for the duration of the COVID-19 declaration under Section 564(b)(1) of the Act, 21 U.S.C. section 360bbb-3(b)(1), unless the authorization is terminated or revoked.  Performed at [facility], [address] [place], [place] [address]   Blood culture (routine single)     Status: Abnormal   Collection Time: 06/01/21  5:39 AM   Specimen: BLOOD  Result Value Ref Range Status   Specimen Description   Final    BLOOD BLOOD RIGHT ARM Performed at [facility], [address] [place], [place] [address]    Special Requests   Final    BOTTLES DRAWN AEROBIC AND ANAEROBIC Blood Culture results may not be optimal due to an inadequate volume of blood received in culture bottles Performed at [facility], [address] [place], [place] [address]    Culture  Setup Time   Final    GRAM POSITIVE RODS BOTTLES DRAWN AEROBIC ONLY CRITICAL RESULT CALLED TO, READ BACK BY AND VERIFIED WITH: JASON ROBBINS@[id number]  06/03/21 RH Performed at [facility],  [address] [place], [place] [address]    Culture (A)  Final    DIPHTHEROIDS(CORYNEBACTERIUM SPECIES) Standardized susceptibility testing for this organism is not  available. Performed at [facility], [address] [place], [place] [address]    Report Status 06/04/2021 FINAL  Final  MRSA Next Gen by PCR, Nasal     Status: Abnormal   Collection Time: 06/01/21  9:15 AM   Specimen: Nasal Mucosa; Nasal Swab  Result Value Ref Range Status   MRSA by PCR Next Gen DETECTED (A) NOT DETECTED Final    Comment: RESULT CALLED TO, READ BACK BY AND VERIFIED WITH: HARRY PRESNELL AT 1119 06/01/21.PMF (NOTE) The GeneXpert MRSA Assay (FDA approved for NASAL specimens only), is one component of a comprehensive MRSA colonization surveillance program. It is not intended to diagnose MRSA infection nor to guide or monitor treatment for MRSA infections. Test performance is not FDA approved in patients less than 74 years old. Performed at [facility], [address] [place], [place] [address]   CULTURE, BLOOD (ROUTINE X 2) w Reflex to ID Panel     Status: None (Preliminary result)   Collection Time: 06/01/21 10:52 AM   Specimen: BLOOD  Result Value Ref Range Status   Specimen Description BLOOD BLOOD RIGHT HAND  Final   Special Requests   Final    BOTTLES DRAWN AEROBIC AND ANAEROBIC Blood Culture adequate volume   Culture   Final    NO GROWTH 3 DAYS Performed at [facility], [address], [place], [place] [address]    Report Status PENDING  Incomplete  Culture, blood (Routine X 2) w Reflex to ID Panel     Status: None (Preliminary result)   Collection Time: 06/01/21  1:56 PM   Specimen: BLOOD  Result Value Ref Range Status   Specimen Description BLOOD RIGHT WRIST  Final   Special Requests   Final    BOTTLES DRAWN AEROBIC AND ANAEROBIC Blood Culture adequate volume   Culture   Final    NO GROWTH 3 DAYS Performed at [facility], [address]  [place], [place] [address]    Report Status PENDING  Incomplete         Radiology Studies: No results found.      Scheduled Meds:  amLODipine  5 mg Oral Daily   carvedilol  12.5 mg Oral BID WC   chlorhexidine  15 mL Mouth Rinse BID   Chlorhexidine Gluconate Cloth  6 each Topical Q0600   enoxaparin (LOVENOX) injection  40 mg Subcutaneous Q24H   furosemide  40 mg Oral Daily   hydrocortisone sod succinate (SOLU-CORTEF) inj  50 mg Intravenous Q8H   mouth rinse  15 mL Mouth Rinse q12n4p   mupirocin ointment  1 application Nasal BID   pantoprazole  40 mg Oral QHS   sertraline  100 mg Oral Daily   Continuous Infusions:  D-10-0.45% Sodium Chloride with KCL 1000 ml 75 mL/hr at 06/04/21 0600     LOS: 3 days    Time spent: 35 minutes    Lynn Ito, MD [facility]   To contact the attending provider between 7A-7P or the covering provider during after hours 7P-7A, please log into the web site [email or web address] and access using universal  password for that web site. If you do not have the password, please call the hospital operator.  06/04/2021, 3:56 PM

## 2021-06-04 NOTE — Consult Note (Signed)
PHARMACY CONSULT NOTE  Pharmacy Consult for Electrolyte Monitoring and Replacement   Recent Labs: Potassium (mmol/L)  Date Value  06/03/2021 3.9  10/12/2013 3.3 (L)   Magnesium (mg/dL)  Date Value  44/96/7591 1.9   Calcium (mg/dL)  Date Value  63/84/6659 8.7 (L)   Calcium, Total (mg/dL)  Date Value  93/57/0177 8.9   Albumin (g/dL)  Date Value  93/90/3009 4.0   Phosphorus (mg/dL)  Date Value  23/30/0762 2.8   Sodium (mmol/L)  Date Value  06/03/2021 136  10/12/2013 136     Assessment: 55 yo female with past medical history of anxiety, CHF, HTN, and EtOH abuse presented to the emergency department with hypoglycemia and hypokalemia.  Pharmacy has been consulted to monitor and replenish electrolytes.  MIVF: D10-1/2NS with KCl/L @ 100 ml/hr   Goal of Therapy:  Electrolytes wnl's  Plan:  Mg 1.7>1.9 after 2 grams IV magnesium sulfate x 1 on 8/30 No further repletion. K 3.9>3.7 Continues D10-1/2NS with KCl/L @ 100 ml/hr. No further repletion. Mg 1.7>1.9. no repletion. f/u BMP, Mg, Phos with am labs  Martyn Malay, PharmD, [facility] Clinical Pharmacist 06/04/2021 9:07 AM

## 2021-06-05 LAB — GLUCOSE, CAPILLARY
Glucose-Capillary: 132 mg/dL — ABNORMAL HIGH (ref 70–99)
Glucose-Capillary: 119 mg/dL — ABNORMAL HIGH (ref 70–99)
Glucose-Capillary: 120 mg/dL — ABNORMAL HIGH (ref 70–99)
Glucose-Capillary: 103 mg/dL — ABNORMAL HIGH (ref 70–99)
Glucose-Capillary: 111 mg/dL — ABNORMAL HIGH (ref 70–99)

## 2021-06-05 LAB — BASIC METABOLIC PANEL
Anion gap: 10 (ref 5–15)
BUN: 14 mg/dL (ref 6–20)
CO2: 26 mmol/L (ref 22–32)
Calcium: 8.9 mg/dL (ref 8.9–10.3)
Chloride: 98 mmol/L (ref 98–111)
Creatinine, Ser: 0.73 mg/dL (ref 0.44–1.00)
GFR, Estimated: >60 mL/min (ref >60)
Glucose, Bld: 104 mg/dL — ABNORMAL HIGH (ref 70–99)
Potassium: 3.6 mmol/L (ref 3.5–5.1)
Sodium: 134 mmol/L — ABNORMAL LOW (ref 135–145)

## 2021-06-05 LAB — MAGNESIUM: Magnesium: 2.1 mg/dL (ref 1.7–2.4)

## 2021-06-05 LAB — PHOSPHORUS: Phosphorus: 2.8 mg/dL (ref 2.5–4.6)

## 2021-06-05 MED ORDER — SPIRONOLACTONE 25 MG PO TABS
25.0000 mg | ORAL_TABLET | Freq: Every day | ORAL | Status: DC
  Administered 2021-06-05 – 2021-06-06 (×2): 25 mg via ORAL
  Filled 2021-06-05 (×2): qty 1

## 2021-06-05 MED ORDER — AMLODIPINE BESYLATE 5 MG PO TABS
5.0000 mg | ORAL_TABLET | Freq: Every day | ORAL | Status: DC
  Administered 2021-06-05 – 2021-06-06 (×2): 5 mg via ORAL
  Filled 2021-06-05 (×2): qty 1

## 2021-06-05 MED ORDER — METOPROLOL TARTRATE 5 MG/5ML IV SOLN
INTRAVENOUS | Status: AC
  Filled 2021-06-05: qty 5

## 2021-06-05 MED ORDER — AMLODIPINE BESYLATE 10 MG PO TABS: 10.0000 mg | ORAL_TABLET | Freq: Every day | ORAL | Status: DC

## 2021-06-05 MED ORDER — METOPROLOL TARTRATE 5 MG/5ML IV SOLN
5.0000 mg | Freq: Once | INTRAVENOUS | Status: AC
  Administered 2021-06-05: 5 mg via INTRAVENOUS

## 2021-06-05 MED ORDER — HYDRALAZINE HCL 20 MG/ML IJ SOLN: 10.0000 mg | Freq: Four times a day (QID) | INTRAMUSCULAR | Status: DC | PRN

## 2021-06-05 MED ORDER — DEXTROSE-NACL 5-0.45 % IV SOLN: INTRAVENOUS | Status: DC

## 2021-06-05 NOTE — Progress Notes (Signed)
Report given to Melody, 1A RN. Opportunity to ask questions provided. Patient transferred via wheelchair to 156. CCMD notified of transfer. Sitter accompanying patient. Patient care transferred.

## 2021-06-05 NOTE — Progress Notes (Signed)
PROGRESS NOTE    Katie [name]  [name] DOB: [birth year] DOA: 06/01/2021 PCP: Katie [name], [facility]   Chief complaint.  Altered mental status. Brief Narrative:  Katie [name] is a 55 year old female with history of nonischemic cardiomyopathy, obstructive sleep apnea, substance abuse, essential hypertension, coronary disease who present to the hospital with intentional insulin injection, hypoglycemia and altered mental status. Upon arriving the hospital, her potassium with 2.6, severe hypoglycemia.  She was admitted to the intensive care stepdown unit, was given D10 infusion, Solu-Cortef, octreotide. 8/31-family at bedside. Pt reports decrease po intake and not much appetite. No other overnight issue.    9/1 patient in a very foul mood mood this AM with me.  She is agitated.  She is upset that she is being stuck in her room with a sitter.  She is going on about the place the area which I am not really understanding what she is talking about.  Discussed with her about why she has a Comptrollersitter.  She wanted to talk to psychiatry told patient and husband who is at bedside what the plan was as far as she has taken too much Lantus prior to coming and she is still on D5 IV fluids to keep her sugars up and wants we are able to wean her off the fluids and her sugar remained stable she will be transferred to inpatient psych.  Overall she has been annoyed and she feels that everyone knows about her and her situation.  Assessment & Plan:   Active Problems:   Suicide attempt (HCC)   Hypoglycemia due to insulin   Hypokalemia   MDD (major depressive disorder), recurrent severe, without psychosis (HCC)   Chronic post-traumatic stress disorder (PTSD)  #1.  Severe hypoglycemia secondary to intentional insulin overdose. Insulin overdose with suicide attempt. Acute toxic encephalopathy secondary to severe hypoglycemia. Seizure secondary to hypoglycemia. Patient still has significant  hypoglycemia earlier this morning. She is continued on Solu-Cortef and octreotide.  She was continued on D10 at 200 mL/h. Currently glucose is better, reduce D10 to 100 mL/h.  Gradually wean off.  It is also eating.  Her mental status gradually improving, but still disoriented to time. Will need to reassess her mental status after fully improved. Patient need to be transferred to mental health when condition stable. 9/1 psych following.  Plan for inpatient psych when medically cleared  Decrease IV fluid rate as patient has history of cardiomyopathy.   DC steroids  Encourage p.o. intake      #2.  Hypokalemia. Hypophosphatemia Improved and stable  #3.  Chronic diastolic congestive heart failure secondary to nonischemic cardiomyopathy. Coronary artery disease. Essential hypertension. Resume Coreg and Aldactone Continue Norvasc and Lasix   #4.  Suicide attempt. Anxiety depression  PTSD.  Plan for inpatient psych transfer when medically stable     DVT prophylaxis: Lovenox Code Status: full Family Communication: Husband at bedside Disposition Plan:    Status is: Inpatient  Remains inpatient appropriate because:Altered mental status, IV treatments appropriate due to intensity of illness or inability to take PO, and Inpatient level of care appropriate due to severity of illness  Dispo: The patient is from: Home              Anticipated d/c is to: Home              Patient currently is not medically stable to d/c.   Difficult to place patient No        I/O last 3 completed  shifts: In: 3953.9 [P.O.:1060; I.V.:2893.9] Out: 3125 [Urine:3125] No intake/output data recorded.     Consultants:  Psych  Procedures: None  Antimicrobials: None Subjective: In foul mood.  Other than complaining she is in the room with a sitter she has no other complaints.  Objective: Vitals:   06/05/21 0300 06/05/21 0400 06/05/21 0403 06/05/21 0804  BP: (!) 157/80  (!) 188/103 (!)  181/109  Pulse: 75 84 82 81  Resp: 20  20 20   Temp:   98.5 F (36.9 C) 98.4 F (36.9 C)  TempSrc:   Oral   SpO2: 98% 99% 99% 97%  Weight: 73.9 kg     Height:        Intake/Output Summary (Last 24 hours) at 06/05/2021 0833 Last data filed at 06/05/2021 0300 Gross per 24 hour  Intake 2350.16 ml  Output 2225 ml  Net 125.16 ml   Filed Weights   06/03/21 0500 06/04/21 0500 06/05/21 0300  Weight: 74.1 kg 74 kg 73.9 kg    Examination: Aggitated , nad Cta no w/r/r Regular s1/s2 no gallop Soft benign +bs No edema aaxoxo3   Data Reviewed: I have personally reviewed following labs and imaging studies  CBC: Recent Labs  Lab 06/01/21 0456 06/02/21 0200 06/03/21 0600  WBC 18.0* 9.7 9.1  NEUTROABS 15.9*  --  5.9  HGB 12.4 11.9* 13.0  HCT 38.3 37.5 40.7  MCV 78.2* 78.0* 79.3*  PLT 310 274 272   Basic Metabolic Panel: Recent Labs  Lab 06/01/21 1958 06/02/21 0200 06/02/21 1530 06/02/21 2233 06/03/21 0600 06/03/21 1428 06/03/21 2231 06/04/21 0629 06/04/21 1036 06/05/21 0431  NA 137 137   < > 136 139 137 136  --   --  134*  K 4.0 4.1   < > 3.4* 3.5 4.1 3.9  --  3.7 3.6  CL 107 110   < > 107 106 107 104  --   --  98  CO2 24 24   < > 23 25 23 24   --   --  26  GLUCOSE 267* 84   < > 114* 87 83 143*  --   --  104*  BUN 9 8   < > <5* 5* <5* 5*  --   --  14  CREATININE 0.81 0.64   < > 0.68 0.74 0.70 0.80  --   --  0.73  CALCIUM 8.3* 8.3*   < > 8.5* 8.3* 8.8* 8.7*  --   --  8.9  MG 2.2 2.0  --   --  1.7  --   --  1.9  --  2.1  PHOS 2.7 1.9*  --   --  2.8  --   --   --   --  2.8   < > = values in this interval not displayed.   GFR: Estimated Creatinine Clearance: 77.3 mL/min (by C-G formula based on SCr of 0.73 mg/dL). Liver Function Tests: Recent Labs  Lab 06/01/21 0456  AST 22  ALT 13  ALKPHOS 56  BILITOT 0.6  PROT 8.0  ALBUMIN 4.0   No results for input(s): LIPASE, AMYLASE in the last 168 hours. Recent Labs  Lab 06/01/21 0539  AMMONIA 14   Coagulation  Profile: Recent Labs  Lab 06/01/21 0539  INR 1.1   Cardiac Enzymes: No results for input(s): CKTOTAL, CKMB, CKMBINDEX, TROPONINI in the last 168 hours. BNP (last 3 results) No results for input(s): PROBNP in the last 8760 hours. HbA1C: No results for input(s):  HGBA1C in the last 72 hours. CBG: Recent Labs  Lab 06/04/21 1619 06/04/21 1953 06/04/21 2319 06/05/21 0317 06/05/21 0813  GLUCAP 146* 128* 150* 132* 119*   Lipid Profile: No results for input(s): CHOL, HDL, LDLCALC, TRIG, CHOLHDL, LDLDIRECT in the last 72 hours. Thyroid Function Tests: No results for input(s): TSH, T4TOTAL, FREET4, T3FREE, THYROIDAB in the last 72 hours.  Anemia Panel: No results for input(s): VITAMINB12, FOLATE, FERRITIN, TIBC, IRON, RETICCTPCT in the last 72 hours. Sepsis Labs: Recent Labs  Lab 06/01/21 0456 06/01/21 1052 06/02/21 0200  PROCALCITON  --  <0.10 <0.10  LATICACIDVEN 0.9 1.7  --     Recent Results (from the past 240 hour(s))  Resp Panel by RT-PCR (Flu A&B, Covid) Nasopharyngeal Swab     Status: None   Collection Time: 06/01/21  4:54 AM   Specimen: Nasopharyngeal Swab; Nasopharyngeal(NP) swabs in vial transport medium  Result Value Ref Range Status   SARS Coronavirus 2 by RT PCR NEGATIVE NEGATIVE Final    Comment: (NOTE) SARS-CoV-2 target nucleic acids are NOT DETECTED.  The SARS-CoV-2 RNA is generally detectable in upper respiratory specimens during the acute phase of infection. The lowest concentration of SARS-CoV-2 viral copies this assay can detect is 138 copies/mL. A negative result does not preclude SARS-Cov-2 infection and should not be used as the sole basis for treatment or other patient management decisions. A negative result may occur with  improper specimen collection/handling, submission of specimen other than nasopharyngeal swab, presence of viral mutation(s) within the areas targeted by this assay, and inadequate number of viral copies(<138 copies/mL). A  negative result must be combined with clinical observations, patient history, and epidemiological information. The expected result is Negative.  Fact Sheet for Patients:  BloggerCourse.com  Fact Sheet for Healthcare Providers:  SeriousBroker.it  This test is no t yet approved or cleared by the [place] FDA and  has been authorized for detection and/or diagnosis of SARS-CoV-2 by FDA under an Emergency Use Authorization (EUA). This EUA will remain  in effect (meaning this test can be used) for the duration of the COVID-19 declaration under Section 564(b)(1) of the Act, 21 U.S.C.section 360bbb-3(b)(1), unless the authorization is terminated  or revoked sooner.       Influenza A by PCR NEGATIVE NEGATIVE Final   Influenza B by PCR NEGATIVE NEGATIVE Final    Comment: (NOTE) The Xpert Xpress SARS-CoV-2/FLU/RSV plus assay is intended as an aid in the diagnosis of influenza from Nasopharyngeal swab specimens and should not be used as a sole basis for treatment. Nasal washings and aspirates are unacceptable for Xpert Xpress SARS-CoV-2/FLU/RSV testing.  Fact Sheet for Patients: BloggerCourse.com  Fact Sheet for Healthcare Providers: SeriousBroker.it  This test is not yet approved or cleared by the [place] FDA and has been authorized for detection and/or diagnosis of SARS-CoV-2 by FDA under an Emergency Use Authorization (EUA). This EUA will remain in effect (meaning this test can be used) for the duration of the COVID-19 declaration under Section 564(b)(1) of the Act, 21 U.S.C. section 360bbb-3(b)(1), unless the authorization is terminated or revoked.  Performed at [facility], [address] [place], [place] [address]   Blood culture (routine single)     Status: Abnormal   Collection Time: 06/01/21  5:39 AM   Specimen: BLOOD  Result Value Ref Range Status    Specimen Description   Final    BLOOD BLOOD RIGHT ARM Performed at [facility], [address] [place], [place] [address]  Special Requests   Final    BOTTLES DRAWN AEROBIC AND ANAEROBIC Blood Culture results may not be optimal due to an inadequate volume of blood received in culture bottles Performed at [facility], [address] [place], [place] [address]    Culture  Setup Time   Final    GRAM POSITIVE RODS BOTTLES DRAWN AEROBIC ONLY CRITICAL RESULT CALLED TO, READ BACK BY AND VERIFIED WITH: JASON [email or web address]  06/03/21 RH Performed at [facility], [address] [place], [place] [address]    Culture (A)  Final    DIPHTHEROIDS(CORYNEBACTERIUM SPECIES) Standardized susceptibility testing for this organism is not available. Performed at [facility], [address] [place], [place] [address]    Report Status 06/04/2021 FINAL  Final  MRSA Next Gen by PCR, Nasal     Status: Abnormal   Collection Time: 06/01/21  9:15 AM   Specimen: Nasal Mucosa; Nasal Swab  Result Value Ref Range Status   MRSA by PCR Next Gen DETECTED (A) NOT DETECTED Final    Comment: RESULT CALLED TO, READ BACK BY AND VERIFIED WITH: HARRY PRESNELL AT 1119 06/01/21.PMF (NOTE) The GeneXpert MRSA Assay (FDA approved for NASAL specimens only), is one component of a comprehensive MRSA colonization surveillance program. It is not intended to diagnose MRSA infection nor to guide or monitor treatment for MRSA infections. Test performance is not FDA approved in patients less than 20 years old. Performed at [facility], [address] [place], [place] [address]   CULTURE, BLOOD (ROUTINE X 2) w Reflex to ID Panel     Status: None (Preliminary result)   Collection Time: 06/01/21 10:52 AM   Specimen: BLOOD  Result Value Ref Range Status   Specimen Description BLOOD BLOOD RIGHT HAND  Final   Special Requests   Final    BOTTLES DRAWN AEROBIC AND ANAEROBIC Blood Culture  adequate volume   Culture   Final    NO GROWTH 4 DAYS Performed at [facility], [address] [place], [place] [address]    Report Status PENDING  Incomplete  Culture, blood (Routine X 2) w Reflex to ID Panel     Status: None (Preliminary result)   Collection Time: 06/01/21  1:56 PM   Specimen: BLOOD  Result Value Ref Range Status   Specimen Description BLOOD RIGHT WRIST  Final   Special Requests   Final    BOTTLES DRAWN AEROBIC AND ANAEROBIC Blood Culture adequate volume   Culture   Final    NO GROWTH 4 DAYS Performed at [facility], [address] [place], [place] [address]    Report Status PENDING  Incomplete         Radiology Studies: No results found.      Scheduled Meds:  amLODipine  5 mg Oral Daily   carvedilol  12.5 mg Oral BID WC   chlorhexidine  15 mL Mouth Rinse BID   enoxaparin (LOVENOX) injection  40 mg Subcutaneous Q24H   furosemide  40 mg Oral Daily   mouth rinse  15 mL Mouth Rinse q12n4p   mupirocin ointment  1 application Nasal BID   pantoprazole  40 mg Oral QHS   sertraline  100 mg Oral Daily   spironolactone  25 mg Oral Daily   Continuous Infusions:  dextrose 5 % and 0.45% NaCl       LOS: 4 days    Time spent: 35 minutes    Lynn Ito, MD [facility]  To contact the attending provider between 7A-7P or the covering provider during after hours 7P-7A, please log into the web site [email or web address] and access using universal [place] password for that web site. If you do not have the password, please call the hospital operator.  06/05/2021, 8:33 AM

## 2021-06-05 NOTE — Progress Notes (Signed)
Vitals entered manually, dynamap wouldn't recognize user ID

## 2021-06-06 ENCOUNTER — Encounter: Payer: Self-pay | Admitting: Psychiatry

## 2021-06-06 ENCOUNTER — Inpatient Hospital Stay
Admission: AD | Admit: 2021-06-06 | Discharge: 2021-06-10 | DRG: 885 | Disposition: A | Discharge status: Home / Self Care | Payer: Medicare Other | Source: Intra-hospital | Attending: Behavioral Health | Admitting: Behavioral Health

## 2021-06-06 ENCOUNTER — Other Ambulatory Visit: Payer: Self-pay

## 2021-06-06 DIAGNOSIS — F332 Major depressive disorder, recurrent severe without psychotic features: Secondary | ICD-10-CM | POA: Diagnosis present

## 2021-06-06 DIAGNOSIS — I110 Hypertensive heart disease with heart failure: Secondary | ICD-10-CM | POA: Diagnosis present

## 2021-06-06 DIAGNOSIS — E162 Hypoglycemia, unspecified: Secondary | ICD-10-CM | POA: Diagnosis present

## 2021-06-06 DIAGNOSIS — Z9151 Personal history of suicidal behavior: Secondary | ICD-10-CM | POA: Diagnosis not present

## 2021-06-06 DIAGNOSIS — G4700 Insomnia, unspecified: Secondary | ICD-10-CM | POA: Diagnosis present

## 2021-06-06 DIAGNOSIS — I10 Essential (primary) hypertension: Secondary | ICD-10-CM | POA: Diagnosis present

## 2021-06-06 DIAGNOSIS — E160 Drug-induced hypoglycemia without coma: Secondary | ICD-10-CM | POA: Diagnosis not present

## 2021-06-06 DIAGNOSIS — F4312 Post-traumatic stress disorder, chronic: Secondary | ICD-10-CM | POA: Diagnosis present

## 2021-06-06 DIAGNOSIS — E876 Hypokalemia: Secondary | ICD-10-CM | POA: Diagnosis present

## 2021-06-06 DIAGNOSIS — Z79899 Other long term (current) drug therapy: Secondary | ICD-10-CM

## 2021-06-06 DIAGNOSIS — Z888 Allergy status to other drugs, medicaments and biological substances status: Secondary | ICD-10-CM | POA: Diagnosis not present

## 2021-06-06 DIAGNOSIS — I5032 Chronic diastolic (congestive) heart failure: Secondary | ICD-10-CM | POA: Diagnosis present

## 2021-06-06 DIAGNOSIS — T1491XA Suicide attempt, initial encounter: Secondary | ICD-10-CM | POA: Diagnosis present

## 2021-06-06 LAB — CULTURE, BLOOD (ROUTINE X 2)
Culture: NO GROWTH
Culture: NO GROWTH
Special Requests: ADEQUATE
Special Requests: ADEQUATE

## 2021-06-06 LAB — GLUCOSE, CAPILLARY
Glucose-Capillary: 101 mg/dL — ABNORMAL HIGH (ref 70–99)
Glucose-Capillary: 107 mg/dL — ABNORMAL HIGH (ref 70–99)
Glucose-Capillary: 109 mg/dL — ABNORMAL HIGH (ref 70–99)
Glucose-Capillary: 118 mg/dL — ABNORMAL HIGH (ref 70–99)
Glucose-Capillary: 128 mg/dL — ABNORMAL HIGH (ref 70–99)
Glucose-Capillary: 128 mg/dL — ABNORMAL HIGH (ref 70–99)
Glucose-Capillary: 136 mg/dL — ABNORMAL HIGH (ref 70–99)

## 2021-06-06 LAB — RESP PANEL BY RT-PCR (FLU A&B, COVID) ARPGX2
Influenza A by PCR: NEGATIVE
Influenza B by PCR: NEGATIVE
SARS Coronavirus 2 by RT PCR: NEGATIVE

## 2021-06-06 MED ORDER — FUROSEMIDE 40 MG PO TABS: 40.0000 mg | ORAL_TABLET | Freq: Every day | ORAL | Status: AC

## 2021-06-06 MED ORDER — SERTRALINE HCL 100 MG PO TABS
100.0000 mg | ORAL_TABLET | Freq: Every day | ORAL | Status: DC
  Administered 2021-06-07 – 2021-06-08 (×2): 100 mg via ORAL
  Filled 2021-06-06 (×2): qty 1

## 2021-06-06 MED ORDER — CARVEDILOL 12.5 MG PO TABS
12.5000 mg | ORAL_TABLET | Freq: Two times a day (BID) | ORAL | Status: DC
  Administered 2021-06-07 – 2021-06-10 (×7): 12.5 mg via ORAL
  Filled 2021-06-06 (×7): qty 1

## 2021-06-06 MED ORDER — MUPIROCIN 2 % EX OINT: 1.0000 "application " | TOPICAL_OINTMENT | Freq: Two times a day (BID) | CUTANEOUS | 0 refills | Status: DC

## 2021-06-06 MED ORDER — TRAZODONE HCL 100 MG PO TABS
100.0000 mg | ORAL_TABLET | Freq: Every evening | ORAL | Status: DC | PRN
  Administered 2021-06-06 – 2021-06-08 (×3): 100 mg via ORAL
  Filled 2021-06-06 (×3): qty 1

## 2021-06-06 MED ORDER — FUROSEMIDE 40 MG PO TABS
40.0000 mg | ORAL_TABLET | Freq: Every day | ORAL | Status: DC
  Administered 2021-06-07 – 2021-06-09 (×3): 40 mg via ORAL
  Filled 2021-06-06 (×3): qty 1

## 2021-06-06 MED ORDER — ORAL CARE MOUTH RINSE
15.0000 mL | Freq: Two times a day (BID) | OROMUCOSAL | Status: DC
  Administered 2021-06-09: 15 mL via OROMUCOSAL

## 2021-06-06 MED ORDER — SPIRONOLACTONE 25 MG PO TABS
25.0000 mg | ORAL_TABLET | Freq: Every day | ORAL | Status: DC
  Administered 2021-06-07 – 2021-06-10 (×4): 25 mg via ORAL
  Filled 2021-06-06 (×4): qty 1

## 2021-06-06 MED ORDER — MAGNESIUM HYDROXIDE 400 MG/5ML PO SUSP: 30.0000 mL | Freq: Every day | ORAL | Status: DC | PRN

## 2021-06-06 MED ORDER — ALUM & MAG HYDROXIDE-SIMETH 200-200-20 MG/5ML PO SUSP: 30.0000 mL | ORAL | Status: DC | PRN

## 2021-06-06 MED ORDER — PANTOPRAZOLE SODIUM 40 MG PO TBEC
40.0000 mg | DELAYED_RELEASE_TABLET | Freq: Every day | ORAL | Status: DC
  Administered 2021-06-07 – 2021-06-09 (×3): 40 mg via ORAL
  Filled 2021-06-06 (×3): qty 1

## 2021-06-06 MED ORDER — ACETAMINOPHEN 325 MG PO TABS: 650.0000 mg | ORAL_TABLET | Freq: Four times a day (QID) | ORAL | Status: DC | PRN

## 2021-06-06 MED ORDER — DOCUSATE SODIUM 100 MG PO CAPS: 100.0000 mg | ORAL_CAPSULE | Freq: Two times a day (BID) | ORAL | Status: DC | PRN

## 2021-06-06 MED ORDER — POLYETHYLENE GLYCOL 3350 17 G PO PACK: 17.0000 g | PACK | Freq: Every day | ORAL | Status: DC | PRN

## 2021-06-06 MED ORDER — HYDROXYZINE HCL 50 MG PO TABS
50.0000 mg | ORAL_TABLET | Freq: Three times a day (TID) | ORAL | Status: DC | PRN
  Administered 2021-06-06: 50 mg via ORAL
  Filled 2021-06-06: qty 1

## 2021-06-06 MED ORDER — AMLODIPINE BESYLATE 5 MG PO TABS
5.0000 mg | ORAL_TABLET | Freq: Every day | ORAL | Status: DC
  Administered 2021-06-07 – 2021-06-10 (×4): 5 mg via ORAL
  Filled 2021-06-06 (×4): qty 1

## 2021-06-06 MED ORDER — ACETAMINOPHEN 325 MG PO TABS: 650.0000 mg | ORAL_TABLET | ORAL | Status: DC | PRN

## 2021-06-06 MED ORDER — MUPIROCIN 2 % EX OINT
1.0000 "application " | TOPICAL_OINTMENT | Freq: Two times a day (BID) | CUTANEOUS | Status: AC
  Administered 2021-06-07 (×2): 1 via NASAL
  Filled 2021-06-06: qty 22

## 2021-06-06 NOTE — Discharge Summary (Signed)
Katie [name] PYP:[id number] DOB: May 22, 1966 DOA: 06/01/2021  PCP: [facility]  Admit date: 06/01/2021 Discharge date: 06/06/2021  Admitted From: home Disposition: Inpatient psych department   Follow-up on discharge from inpatient psych: Follow-up with cardiology in 2 weeks Follow-up with PCP in 2 weeks Will need BMP and 2 days   Discharge Condition:Stable CODE STATUS: Full code Diet recommendation: Heart Healthy  Brief/Interim Summary: 55 y.o female with significant PMH as below who presented to the ED via EMS with chief complaints of altered mental status.   Per ED notes,Patient has been persistently hypoglycemic without any history or evidence of insulin use or any history of diabetes to suggest the use of oral antiglycemic's.  The patient denies taking any drugs and her mental status has improved after administration of D50 prior to arrival, but apparently EMS also provided supplementary dextrose about 4 hours ago and then she got worse. Patient's husband revealed to the ED physicians that a woman that was staying with the patient just prior to the husband's arrival home told him that the patient reported that she had injected herself with 3 syringes of lantus insulin (900 untis in total) that belonged to someone else, possibly the patient's father.  There was no indication of why she would do this.  She did not express any suicidal ideation but her husband says she has been under a lot of stress recently and has not had any sleep for several days.   ED Course: On arrival to the ED, she was afebrile with blood pressure 172/134 mm Hg and pulse rate 95 beats/min. There were no focal neurological deficits; she was alert and oriented to self only. EKG with sinus tachycardia and frequent ectopy, and nonspecific ST segment/T wave changes but no clear evidence of acute ischemia.  Labs significant for profound hypokalemia, with K 2.6--recheck pending, s/p 40 mEq of PO  Potassium supplement, Mg 1.9, Glucose 129 WBC/Hgb/Hct/Plts:  18.0/12.4/38.3/310 (08/28 0456)  ABG: Venous/Arterial Blood Gas result:  pO2 pending; pCO2 46; pH 7.4;  HCO3 28.5, %O2 Sat 55.1 Lactate: 0.9  PCCM was consulted for admission to the ICU.  Severe Hypoglycemia secondary to Intentional Insulin overdose (Apparently took total of 900 units of Lantus) Patient presenting with Neuroglycopenic symptoms of AMS and seizure Was placed on D50 IVF and octreotide drip Hypoglycemic protocol Close monitoring of electrolytes and blood glucose levels Once patient was stabilized she was transferred to the medical floor. She was kept on IV fluids until her p.o. intake was increased.  Encouraged to take p.o. intake and her IV fluids were discontinued.  Blood glucose levels appear to be stabilized and she is stable medically to be transferred to inpatient psych today.    Acute Metabolic Encephalopathy due to Hypoglycemia as above -Provide supportive care -CT head negative for acute intracranial abnormality Improved   Seizures -hypoglycemic induced -Seizure precautions were established -Treat hypoglycemia as above -As needed lorazepam for breakthrough seizures.  While on [facility] service we did not observe any seizures.   Hypokalemia K+ 2.6 on admission Reports history of frequent low p.o. K+, no prior work-up for alternative reasons for hypokalemia.  Prior concerns for hyperaldosteronism or renal potassium wasting Was started on stress steroids Diuretics were held Electrolytes were supplemented Will need to monitor closely   Leukocytosis : Likely stress/reactive CXR negative On blood culture from 8/28 was Diptheroid, repeats were negative     Chronic Diastolic HFpEF (last known EF 24% on 02/2017) -Nonischemic cardiomyopathy, hypertension Hx: CAD, HLD  Euvolemic, cp free Resumed her beta blk, aldactone, and lasix Holding ramipril. Will need to initiate cardiac meds slowly Monitor bmp in  am  -Lasix as blood pressure and renal function permits; currently on Lasix 80 mg IV BID    Anxiety and depression Hx: PTSD Now with concerns of intentional insulin overdose -Continue IVC  Psych consulted-going to inpatient psych unit  Substance abuse Tox screen was positive for bzd, cocaine, and cannabinoid  Discharge Diagnoses:  Active Problems:   Suicide attempt (HCC)   Hypoglycemia due to insulin   Hypokalemia   MDD (major depressive disorder), recurrent severe, without psychosis (HCC)   Chronic post-traumatic stress disorder (PTSD)    Discharge Instructions  Discharge Instructions     Diet - low sodium heart healthy   Complete by: As directed    Increase activity slowly   Complete by: As directed       Allergies as of 06/06/2021       Reactions   Wasp Venom Protein Shortness Of Breath   Ciprofloxacin Rash, Other (See Comments)   Other Reaction: HIVES AND BLISTERS        Medication List     STOP taking these medications    clorazepate 7.5 MG tablet Commonly known as: TRANXENE   fluticasone 27.5 MCG/SPRAY nasal spray Commonly known as: VERAMYST   fluticasone 50 MCG/ACT nasal spray Commonly known as: FLONASE   gabapentin 300 MG capsule Commonly known as: NEURONTIN   potassium chloride SA 20 MEQ tablet Commonly known as: KLOR-CON   prazosin 5 MG capsule Commonly known as: MINIPRESS   ramipril 10 MG capsule Commonly known as: ALTACE   rosuvastatin 20 MG tablet Commonly known as: CRESTOR   temazepam 15 MG capsule Commonly known as: RESTORIL   zolpidem 10 MG tablet Commonly known as: AMBIEN       TAKE these medications    albuterol 108 (90 Base) MCG/ACT inhaler Commonly known as: VENTOLIN HFA Inhale 2 puffs into the lungs every 6 (six) hours as needed for wheezing or shortness of breath. What changed: Another medication with the same name was removed. Continue taking this medication, and follow the directions you see here.    amLODipine 5 MG tablet Commonly known as: NORVASC Take 5 mg by mouth daily.   carvedilol 12.5 MG tablet Commonly known as: COREG Take 12.5 mg by mouth 2 (two) times daily with a meal.   Cholecalciferol 25 MCG (1000 UT) tablet Take 1,000 Units by mouth daily.   furosemide 40 MG tablet Commonly known as: LASIX Take 1 tablet (40 mg total) by mouth daily. Start taking on: June 07, 2021 What changed:  medication strength how much to take when to take this   mupirocin ointment 2 % Commonly known as: BACTROBAN Place 1 application into the nose 2 (two) times daily.   omeprazole 20 MG capsule Commonly known as: PRILOSEC Take 20 mg by mouth daily.   sertraline 100 MG tablet Commonly known as: ZOLOFT Take 100 mg by mouth daily.   spironolactone 25 MG tablet Commonly known as: ALDACTONE Take 25 mg by mouth daily.        Allergies  Allergen Reactions   Wasp Venom Protein Shortness Of Breath   Ciprofloxacin Rash and Other (See Comments)    Other Reaction: HIVES AND BLISTERS    Consultations: PCCM, psychiatry   Procedures/Studies: CT HEAD WO CONTRAST (5MM)  Result Date: 06/01/2021 CLINICAL DATA:  [age] year old female is unresponsive. Unexplained altered mental status. EXAM: CT  HEAD WITHOUT CONTRAST TECHNIQUE: Contiguous axial images were obtained from the base of the skull through the vertex without intravenous contrast. COMPARISON:  Head CT 03/29/2008. FINDINGS: Brain: Cerebral volume is stable and within normal limits. No midline shift, ventriculomegaly, mass effect, evidence of mass lesion, intracranial hemorrhage or evidence of cortically based acute infarction. Gray-white matter differentiation is within normal limits throughout the brain. No edema or encephalomalacia identified. Vascular: Calcified atherosclerosis at the skull base. No suspicious intracranial vascular hyperdensity. Skull: Intact, negative. Sinuses/Orbits: Interval left side maxillary antrostomy.  Visualized paranasal sinuses and mastoids are clear. Other: Visualized orbits and scalp soft tissues are within normal limits. IMPRESSION: Normal noncontrast Head CT. Electronically Signed   By: Odessa Fleming M.D.   On: 06/01/2021 05:38   DG Chest Port 1 View  Result Date: 06/02/2021 CLINICAL DATA:  Pulmonary vascular congestion, insulin overdose EXAM: PORTABLE CHEST 1 VIEW COMPARISON:  06/01/2021 FINDINGS: Low lung volumes with vascular crowding. Mild interstitial edema is possible. However, there are no definite pleural effusions. No pneumothorax. The heart is mildly enlarged. IMPRESSION: Cardiomegaly with possible mild interstitial edema. No definite pleural effusions. Electronically Signed   By: Charline Bills M.D.   On: 06/02/2021 02:52   DG Chest Port 1 View  Result Date: 06/01/2021 CLINICAL DATA:  Sepsis. EXAM: PORTABLE CHEST 1 VIEW COMPARISON:  12/02/2015 FINDINGS: Normal heart size. Stable cardiomediastinal contours. Low lung volumes with pulmonary vascular congestion. Visualized osseous structures are unremarkable. IMPRESSION: 1. Low lung volumes and pulmonary vascular congestion Electronically Signed   By: Signa Kell M.D.   On: 06/01/2021 05:21      Subjective: No complaints this am. Denies sob or cp  Discharge Exam: Vitals:   06/06/21 0829 06/06/21 1454  BP: 118/68 (!) 129/103  Pulse: 74 89  Resp: 18 17  Temp:    SpO2: 99% 98%   Vitals:   06/06/21 0437 06/06/21 0500 06/06/21 0829 06/06/21 1454  BP: (!) 135/94  118/68 (!) 129/103  Pulse: 88  74 89  Resp: 18  18 17   Temp: 99 F (37.2 C)     TempSrc: Oral     SpO2: 100%  99% 98%  Weight:  73.9 kg    Height:        General: Pt is alert, awake, not in acute distress Cardiovascular: RRR, S1/S2 +, no rubs, no gallops Respiratory: CTA bilaterally, no wheezing, no rhonchi Abdominal: Soft, NT, ND, bowel sounds + Extremities: no edema, no cyanosis    The results of significant diagnostics from this hospitalization  (including imaging, microbiology, ancillary and laboratory) are listed below for reference.     Microbiology: Recent Results (from the past 240 hour(s))  Resp Panel by RT-PCR (Flu A&B, Covid) Nasopharyngeal Swab     Status: None   Collection Time: 06/01/21  4:54 AM   Specimen: Nasopharyngeal Swab; Nasopharyngeal(NP) swabs in vial transport medium  Result Value Ref Range Status   SARS Coronavirus 2 by RT PCR NEGATIVE NEGATIVE Final    Comment: (NOTE) SARS-CoV-2 target nucleic acids are NOT DETECTED.  The SARS-CoV-2 RNA is generally detectable in upper respiratory specimens during the acute phase of infection. The lowest concentration of SARS-CoV-2 viral copies this assay can detect is 138 copies/mL. A negative result does not preclude SARS-Cov-2 infection and should not be used as the sole basis for treatment or other patient management decisions. A negative result may occur with  improper specimen collection/handling, submission of specimen other than nasopharyngeal swab, presence of viral mutation(s) within the  areas targeted by this assay, and inadequate number of viral copies(<138 copies/mL). A negative result must be combined with clinical observations, patient history, and epidemiological information. The expected result is Negative.  Fact Sheet for Patients:  BloggerCourse.com  Fact Sheet for Healthcare Providers:  SeriousBroker.it  This test is no t yet approved or cleared by the [place] FDA and  has been authorized for detection and/or diagnosis of SARS-CoV-2 by FDA under an Emergency Use Authorization (EUA). This EUA will remain  in effect (meaning this test can be used) for the duration of the COVID-19 declaration under Section 564(b)(1) of the Act, 21 U.S.C.section 360bbb-3(b)(1), unless the authorization is terminated  or revoked sooner.       Influenza A by PCR NEGATIVE NEGATIVE Final   Influenza B by PCR  NEGATIVE NEGATIVE Final    Comment: (NOTE) The Xpert Xpress SARS-CoV-2/FLU/RSV plus assay is intended as an aid in the diagnosis of influenza from Nasopharyngeal swab specimens and should not be used as a sole basis for treatment. Nasal washings and aspirates are unacceptable for Xpert Xpress SARS-CoV-2/FLU/RSV testing.  Fact Sheet for Patients: BloggerCourse.com  Fact Sheet for Healthcare Providers: SeriousBroker.it  This test is not yet approved or cleared by the [place] FDA and has been authorized for detection and/or diagnosis of SARS-CoV-2 by FDA under an Emergency Use Authorization (EUA). This EUA will remain in effect (meaning this test can be used) for the duration of the COVID-19 declaration under Section 564(b)(1) of the Act, 21 U.S.C. section 360bbb-3(b)(1), unless the authorization is terminated or revoked.  Performed at [facility], [address] [place], [place] [address]   Blood culture (routine single)     Status: Abnormal   Collection Time: 06/01/21  5:39 AM   Specimen: BLOOD  Result Value Ref Range Status   Specimen Description   Final    BLOOD BLOOD RIGHT ARM Performed at [facility], [address] [place], [place] [address]    Special Requests   Final    BOTTLES DRAWN AEROBIC AND ANAEROBIC Blood Culture results may not be optimal due to an inadequate volume of blood received in culture bottles Performed at [facility], [address] [place], [place] [address]    Culture  Setup Time   Final    GRAM POSITIVE RODS BOTTLES DRAWN AEROBIC ONLY CRITICAL RESULT CALLED TO, READ BACK BY AND VERIFIED WITH: JASON [email or web address]@0133  06/03/21 RH Performed at [facility], [address] [place], [place] [address]    Culture (A)  Final    DIPHTHEROIDS(CORYNEBACTERIUM SPECIES) Standardized susceptibility testing for this organism is not available. Performed at [facility], [address], [place], [place] [address]    Report Status 06/04/2021 FINAL  Final  MRSA Next Gen by PCR, Nasal     Status: Abnormal   Collection Time: 06/01/21  9:15 AM   Specimen: Nasal Mucosa; Nasal Swab  Result Value Ref Range Status   MRSA by PCR Next Gen DETECTED (A) NOT DETECTED Final    Comment: RESULT CALLED TO, READ BACK BY AND VERIFIED WITH: HARRY PRESNELL AT 1119 06/01/21.PMF (NOTE) The GeneXpert MRSA Assay (FDA approved for NASAL specimens only), is one component of a comprehensive MRSA colonization surveillance program. It is not intended to diagnose MRSA infection nor to guide or monitor treatment for MRSA infections. Test performance is not FDA approved in patients less than 31 years old. Performed at [facility], [address] [place], [place]  [address]   CULTURE, BLOOD (ROUTINE X 2) w Reflex to ID Panel     Status: None   Collection Time: 06/01/21 10:52 AM   Specimen: BLOOD  Result Value Ref Range Status   Specimen Description BLOOD BLOOD RIGHT HAND  Final   Special Requests   Final    BOTTLES DRAWN AEROBIC AND ANAEROBIC Blood Culture adequate volume   Culture   Final    NO GROWTH 5 DAYS Performed at [facility], [address] [place], [place] [address]    Report Status 06/06/2021 FINAL  Final  Culture, blood (Routine X 2) w Reflex to ID Panel     Status: None   Collection Time: 06/01/21  1:56 PM   Specimen: BLOOD  Result Value Ref Range Status   Specimen Description BLOOD RIGHT WRIST  Final   Special Requests   Final    BOTTLES DRAWN AEROBIC AND ANAEROBIC Blood Culture adequate volume   Culture   Final    NO GROWTH 5 DAYS Performed at [facility], [address] [place], [place] [address]    Report Status 06/06/2021 FINAL  Final     Labs: BNP (last 3 results) Recent Labs    06/01/21 1052  BNP 87.0   Basic Metabolic Panel: Recent Labs  Lab 06/01/21 1958 06/02/21 0200 06/02/21 1530 06/02/21 2233  06/03/21 0600 06/03/21 1428 06/03/21 2231 06/04/21 0629 06/04/21 1036 06/05/21 0431  NA 137 137   < > 136 139 137 136  --   --  134*  K 4.0 4.1   < > 3.4* 3.5 4.1 3.9  --  3.7 3.6  CL 107 110   < > 107 106 107 104  --   --  98  CO2 24 24   < > 23 25 23 24   --   --  26  GLUCOSE 267* 84   < > 114* 87 83 143*  --   --  104*  BUN 9 8   < > <5* 5* <5* 5*  --   --  14  CREATININE 0.81 0.64   < > 0.68 0.74 0.70 0.80  --   --  0.73  CALCIUM 8.3* 8.3*   < > 8.5* 8.3* 8.8* 8.7*  --   --  8.9  MG 2.2 2.0  --   --  1.7  --   --  1.9  --  2.1  PHOS 2.7 1.9*  --   --  2.8  --   --   --   --  2.8   < > = values in this interval not displayed.   Liver Function Tests: Recent Labs  Lab 06/01/21 0456  AST 22  ALT 13  ALKPHOS 56  BILITOT 0.6  PROT 8.0  ALBUMIN 4.0   No results for input(s): LIPASE, AMYLASE in the last 168 hours. Recent Labs  Lab 06/01/21 0539  AMMONIA 14   CBC: Recent Labs  Lab 06/01/21 0456 06/02/21 0200 06/03/21 0600  WBC 18.0* 9.7 9.1  NEUTROABS 15.9*  --  5.9  HGB 12.4 11.9* 13.0  HCT 38.3 37.5 40.7  MCV 78.2* 78.0* 79.3*  PLT 310 274 272   Cardiac Enzymes: No results for input(s): CKTOTAL, CKMB, CKMBINDEX, TROPONINI in the last 168 hours. BNP: Invalid input(s): POCBNP CBG: Recent Labs  Lab 06/05/21 2006 06/06/21 0052 06/06/21 0448 06/06/21 0729 06/06/21 1201  GLUCAP 111* 107* 136* 101* 118*   D-Dimer No results for input(s): DDIMER in  the last 72 hours. Hgb A1c No results for input(s): HGBA1C in the last 72 hours. Lipid Profile No results for input(s): CHOL, HDL, LDLCALC, TRIG, CHOLHDL, LDLDIRECT in the last 72 hours. Thyroid function studies No results for input(s): TSH, T4TOTAL, T3FREE, THYROIDAB in the last 72 hours.  Invalid input(s): FREET3 Anemia work up No results for input(s): VITAMINB12, FOLATE, FERRITIN, TIBC, IRON, RETICCTPCT in the last 72 hours. Urinalysis    Component Value Date/Time   COLORURINE YELLOW (A) 06/01/2021 0915    APPEARANCEUR CLOUDY (A) 06/01/2021 0915   LABSPEC 1.009 06/01/2021 0915   PHURINE 7.0 06/01/2021 0915   GLUCOSEU 50 (A) 06/01/2021 0915   HGBUR SMALL (A) 06/01/2021 0915   BILIRUBINUR NEGATIVE 06/01/2021 0915   KETONESUR NEGATIVE 06/01/2021 0915   PROTEINUR 100 (A) 06/01/2021 0915   NITRITE NEGATIVE 06/01/2021 0915   LEUKOCYTESUR SMALL (A) 06/01/2021 0915   Sepsis Labs Invalid input(s): PROCALCITONIN,  WBC,  LACTICIDVEN Microbiology Recent Results (from the past 240 hour(s))  Resp Panel by RT-PCR (Flu A&B, Covid) Nasopharyngeal Swab     Status: None   Collection Time: 06/01/21  4:54 AM   Specimen: Nasopharyngeal Swab; Nasopharyngeal(NP) swabs in vial transport medium  Result Value Ref Range Status   SARS Coronavirus 2 by RT PCR NEGATIVE NEGATIVE Final    Comment: (NOTE) SARS-CoV-2 target nucleic acids are NOT DETECTED.  The SARS-CoV-2 RNA is generally detectable in upper respiratory specimens during the acute phase of infection. The lowest concentration of SARS-CoV-2 viral copies this assay can detect is 138 copies/mL. A negative result does not preclude SARS-Cov-2 infection and should not be used as the sole basis for treatment or other patient management decisions. A negative result may occur with  improper specimen collection/handling, submission of specimen other than nasopharyngeal swab, presence of viral mutation(s) within the areas targeted by this assay, and inadequate number of viral copies(<138 copies/mL). A negative result must be combined with clinical observations, patient history, and epidemiological information. The expected result is Negative.  Fact Sheet for Patients:  BloggerCourse.com  Fact Sheet for Healthcare Providers:  SeriousBroker.it  This test is no t yet approved or cleared by the [place] FDA and  has been authorized for detection and/or diagnosis of SARS-CoV-2 by FDA under an Emergency  Use Authorization (EUA). This EUA will remain  in effect (meaning this test can be used) for the duration of the COVID-19 declaration under Section 564(b)(1) of the Act, 21 U.S.C.section 360bbb-3(b)(1), unless the authorization is terminated  or revoked sooner.       Influenza A by PCR NEGATIVE NEGATIVE Final   Influenza B by PCR NEGATIVE NEGATIVE Final    Comment: (NOTE) The Xpert Xpress SARS-CoV-2/FLU/RSV plus assay is intended as an aid in the diagnosis of influenza from Nasopharyngeal swab specimens and should not be used as a sole basis for treatment. Nasal washings and aspirates are unacceptable for Xpert Xpress SARS-CoV-2/FLU/RSV testing.  Fact Sheet for Patients: BloggerCourse.com  Fact Sheet for Healthcare Providers: SeriousBroker.it  This test is not yet approved or cleared by the [place] FDA and has been authorized for detection and/or diagnosis of SARS-CoV-2 by FDA under an Emergency Use Authorization (EUA). This EUA will remain in effect (meaning this test can be used) for the duration of the COVID-19 declaration under Section 564(b)(1) of the Act, 21 U.S.C. section 360bbb-3(b)(1), unless the authorization is terminated or revoked.  Performed at [facility], [address], [place], [place] [address]   Blood culture (routine single)  Status: Abnormal   Collection Time: 06/01/21  5:39 AM   Specimen: BLOOD  Result Value Ref Range Status   Specimen Description   Final    BLOOD BLOOD RIGHT ARM Performed at [facility], [address] [place], [place] [address]    Special Requests   Final    BOTTLES DRAWN AEROBIC AND ANAEROBIC Blood Culture results may not be optimal due to an inadequate volume of blood received in culture bottles Performed at [facility], [address], [place], [place] [address]    Culture  Setup Time   Final    GRAM POSITIVE RODS BOTTLES DRAWN  AEROBIC ONLY CRITICAL RESULT CALLED TO, READ BACK BY AND VERIFIED WITH: JASON [email or web address]@0133  06/03/21 RH Performed at [facility], [address] [place], [place] [address]    Culture (A)  Final    DIPHTHEROIDS(CORYNEBACTERIUM SPECIES) Standardized susceptibility testing for this organism is not available. Performed at [facility], [address], [place], [place] [address]    Report Status 06/04/2021 FINAL  Final  MRSA Next Gen by PCR, Nasal     Status: Abnormal   Collection Time: 06/01/21  9:15 AM   Specimen: Nasal Mucosa; Nasal Swab  Result Value Ref Range Status   MRSA by PCR Next Gen DETECTED (A) NOT DETECTED Final    Comment: RESULT CALLED TO, READ BACK BY AND VERIFIED WITH: HARRY PRESNELL AT 1119 06/01/21.PMF (NOTE) The GeneXpert MRSA Assay (FDA approved for NASAL specimens only), is one component of a comprehensive MRSA colonization surveillance program. It is not intended to diagnose MRSA infection nor to guide or monitor treatment for MRSA infections. Test performance is not FDA approved in patients less than 73 years old. Performed at [facility], [address] [place], [place] [address]   CULTURE, BLOOD (ROUTINE X 2) w Reflex to ID Panel     Status: None   Collection Time: 06/01/21 10:52 AM   Specimen: BLOOD  Result Value Ref Range Status   Specimen Description BLOOD BLOOD RIGHT HAND  Final   Special Requests   Final    BOTTLES DRAWN AEROBIC AND ANAEROBIC Blood Culture adequate volume   Culture   Final    NO GROWTH 5 DAYS Performed at [facility], [address], [place], [place] [address]    Report Status 06/06/2021 FINAL  Final  Culture, blood (Routine X 2) w Reflex to ID Panel     Status: None   Collection Time: 06/01/21  1:56 PM   Specimen: BLOOD  Result Value Ref Range Status   Specimen Description BLOOD RIGHT WRIST  Final   Special Requests   Final    BOTTLES DRAWN AEROBIC AND ANAEROBIC Blood Culture adequate volume    Culture   Final    NO GROWTH 5 DAYS Performed at [facility], [address] [place], [place] [address]    Report Status 06/06/2021 FINAL  Final     Time coordinating discharge: Over 30 minutes  SIGNED:   Lynn Ito, MD  [facility] 06/06/2021, 2:57 PM Pager   If 7PM-7AM, please contact night-coverage [email or web address] Password [email or web address]

## 2021-06-06 NOTE — Progress Notes (Signed)
Report called to [place], Charity fundraiser in [facility]). Patient in stable condition, desired to leave AMA. Family member approached desk requesting to discharge patient AMA. Sitter present at bedside.  Charge nurse notified, [facility] and Security called to unit to assist with matter. Family educated on patient current inpatient IVC status and pending transfer to [facility]. He did not agree with physicians orders or IVC and informed that he plans to continue to try to help patient leave AMA. [facility] staff notified.

## 2021-06-06 NOTE — Progress Notes (Signed)
Patient arrived to behavorial health unit in no apparent distress. Skin assessment completed and unremarkable. She denies SI/HI during initial assessment. She does appear anxious and in a depressed mood.

## 2021-06-06 NOTE — BH Assessment (Signed)
Patient is to be admitted to [facility] by Psych MD Dr. Toni Amend.  Attending Physician will be Dr. Neale Burly.   Patient has been assigned to room 325, by [facility] Charge Nurse Britta Mccreedy.   Intake Paper Work has been signed and placed on patient chart.  ER staff is aware of the admission: Nitchia, ER Secretary   Dr. Marylu Lund, Attending MD  Rosey Bath, Patient's Nurse  Sue Lush, Patient Access.

## 2021-06-06 NOTE — Tx Team (Cosign Needed)
Initial Treatment Plan 06/06/2021 10:17 PM Katie [name] [id number]    PATIENT STRESSORS: Health problems   Medication change or noncompliance     PATIENT STRENGTHS: Motivation for treatment/growth  Supportive family/friends    PATIENT IDENTIFIED PROBLEMS: Suicidal ideation   Depression    Anxiety                 DISCHARGE CRITERIA:  Improved stabilization in mood, thinking, and/or behavior Medical problems require only outpatient monitoring  PRELIMINARY DISCHARGE PLAN: Outpatient therapy  PATIENT/FAMILY INVOLVEMENT: This treatment plan has been presented to and reviewed with the patient, Katie [name], The patient and family have been given the opportunity to ask questions and make suggestions.  Annamarie Major, RN 06/06/2021, 10:17 PM

## 2021-06-06 NOTE — Progress Notes (Signed)
Report received from [place], [place] on unit 1A. Patient to be transported to [facility].

## 2021-06-06 NOTE — Consult Note (Signed)
  Chart reviewed.  Spoke with hospitalist and with treatment team downstairs.  Patient who made a serious suicide attempt by insulin overdose now appears to be medically stable for transfer to psychiatric ward.  COVID test negative.  Discharge readmission orders are being placed so that patient can be transferred to psychiatric unit when nursing staff is prepared.

## 2021-06-06 NOTE — Progress Notes (Signed)
Spoke with patient this morning. Patient upset and wants to be moved to behavioral health unit. I explained to patient that transferring her is indeed the plan once she is medically cleared. She wants to speak with the Psychiatry team for further clarification. No distress noted otherwise.

## 2021-06-07 DIAGNOSIS — F332 Major depressive disorder, recurrent severe without psychotic features: Principal | ICD-10-CM

## 2021-06-07 LAB — GLUCOSE, CAPILLARY
Glucose-Capillary: 104 mg/dL — ABNORMAL HIGH (ref 70–99)
Glucose-Capillary: 117 mg/dL — ABNORMAL HIGH (ref 70–99)
Glucose-Capillary: 141 mg/dL — ABNORMAL HIGH (ref 70–99)
Glucose-Capillary: 156 mg/dL — ABNORMAL HIGH (ref 70–99)

## 2021-06-07 MED ORDER — OLANZAPINE 5 MG PO TABS
2.5000 mg | ORAL_TABLET | Freq: Every day | ORAL | Status: DC
  Administered 2021-06-07: 2.5 mg via ORAL
  Filled 2021-06-07: qty 1

## 2021-06-07 NOTE — Group Note (Signed)
LCSW Group Therapy Note  Group Date: 06/07/2021 Start Time: 1305 End Time: 1405   Type of Therapy and Topic:  Group Therapy - Healthy vs Unhealthy Coping Skills  Participation Level:  Minimal   Description of Group The focus of this group was to determine what unhealthy coping techniques typically are used by group members and what healthy coping techniques would be helpful in coping with various problems. Patients were guided in becoming aware of the differences between healthy and unhealthy coping techniques. Patients were asked to identify 2-3 healthy coping skills they would like to learn to use more effectively.  Therapeutic Goals Patients learned that coping is what human beings do all day long to deal with various situations in their lives Patients defined and discussed healthy vs unhealthy coping techniques Patients identified their preferred coping techniques and identified whether these were healthy or unhealthy Patients determined 2-3 healthy coping skills they would like to become more familiar with and use more often. Patients provided support and ideas to each other   Summary of Patient Progress:  During group, patient expressed how her recent experience in the emergency department was upsetting because she felt like no one told her what was going on. Patient additionally shared that prior to her hospitalization, she "tried to talk and no one cared". Patient presented as solemn with a depressed affect. Patient identified spiritual cleansing as a healthy coping technique that has helped her in the past. During group, patient complained of her right hand hurting. This CSW notified nursing of patient's somatic complaint after group ended.   Therapeutic Modalities Cognitive Behavioral Therapy Motivational Interviewing  Norberto Sorenson, Theresia Majors 06/07/2021  5:10 PM

## 2021-06-07 NOTE — BHH Suicide Risk Assessment (Signed)
[facility] Admission Suicide Risk Assessment   Nursing information obtained from:  Patient Demographic factors:  Caucasian Current Mental Status:  NA Loss Factors:  Loss of significant relationship Historical Factors:  Family history of mental illness or substance abuse Risk Reduction Factors:  Positive social support, Living with another person, especially a relative, Sense of responsibility to family  Total Time spent with patient: 50 min Principal Problem: <principal problem not specified> Diagnosis:  Active Problems:   Severe recurrent major depression without psychotic features (HCC)  Subjective Data- C/C and HPI- A lot going on, I took overdose"  Chart reviewed, pt seen. Patient who made a serious suicide attempt by insulin overdose.  Per psych consult- NIAH HEINLE is a 55 y.o. female patient admitted with hypoglycemia and hypokalemia secondary to intentional insulin injection, pt now medically cleared . Pt treated by [facility]. Patient seen one-on-one at bedside today. She is speaking at a barely audible whisper, but her thought process is coherent, and she is oriented at time of interview. She does admit to trying to kill herself with insuline, and states "it was stupid." She regrets trying to end her life, and notes that her family is very supportive of her. She wants to make sure it is understood that her family was not the cause of her wanting to end her life. She states her most recent episode of depression stemmed from joining a support group on Facebook. Initially, patient found this to be an enjoyable new form of social support with occasional messages and calls, but it quickly escalated into a time consuming strain. She has people from all over the country messaging her and calling her at all hours of the day and night, and she was unable to sleep. She became increasingly stressed out, but noted she was unable to successfully set limits with this group out of guilt.  She was sleeping roughly 2-3 hours due to phone calls, but felt tremendously tired with low energy and hopelessness. Eventually, she felt the only way to escape this group was to end  her life, and she took some of her mother's old insulin that was in the house. She regrets this attempt now. She has been seen by [facility] for a number of years, and felt her Zoloft and Prazosin were helpful for her. She gives me permission to speak to her husband.   Contacted husband, Alfredo Bach, [phone]. Husband confirms that she was on a social media group, and people were indeed calling her at all hours of the day including 2-3AM. He knew she was stressed from this and told her to get off social media or block the group members. He thought she had improved, and initially when he got home from work she was acting normally and they got in the hot tub. Then she became altered and required EMS. It was not until she arrived at the hospital that she finally admitted to taking insulin.      Pt seen in her room, pt lying in bed, Pt speaks very soft and slow, makes poor eey contact, severe psychomotor retardation. Pt admits overdosing on meds/insulin, reports being stressed out , being " aggravated" by " group of people" around the world, taking over her account and not able to use her debit card, appears suspicious .  Reports sleep problems , feeling exhausted and tired, poor concentration, endorses hopelessness, helplessness.   Associated Signs/Symptoms: Total Time spent with patient: 50 min   Past Psychiatric History: [employer] in  [place]. Prescribed temazepam 15 mg QHS, Ambien 10 mg QHS, prazosin 2 mg QHS, and sertraline 100 mg daily. Has seen therapist in the past, but not currently. No other previous suicide attempts, no hospital stays. No hx of ECT per pt  Continued Clinical Symptoms:  Alcohol Use Disorder Identification Test Final Score (AUDIT): 0 The "Alcohol Use Disorders Identification Test",  Guidelines for Use in Primary Care, Second Edition.  World Science writer [facility]). Score between 0-7:  no or low risk or alcohol related problems. Score between 8-15:  moderate risk of alcohol related problems. Score between 16-19:  high risk of alcohol related problems. Score 20 or above:  warrants further diagnostic evaluation for alcohol dependence and treatment.   CLINICAL FACTORS:   Severe Anxiety and/or Agitation Depression:   Hopelessness Insomnia Severe Previous Psychiatric Diagnoses and Treatments   Musculoskeletal: Strength & Muscle Tone: within normal limits Gait & Station: lying in bed Patient leans: N/A  Psychiatric Specialty Exam:   Presentation  General Appearance: Disheveled   Eye Contact poor   Speech: slow, soft   Speech Volume:Decreased   Handedness:Right     Mood and Affect  Mood:Hopeless; Depressed; Anxious, suspicious   Affect: restricted, depressed     Thought Process  Thought Processes: circumstantial   Descriptions of Associations:Intact   Orientation:Full (Time, Place and Person)   Thought Content:Rumination, helpless, suspicious   History of Schizophrenia/Schizoaffective disorder:No data recorded Duration of Psychotic Symptoms:No data recorded Hallucinations:Hallucinations: None   Ideas of Reference:None   Suicidal Thoughts:Suicidal Thoughts: Yes, Passive   Homicidal Thoughts:Homicidal Thoughts: No     Sensorium  Memory:Immediate Fair; Recent Fair; Remote Fair   Judgment:Intact   Insight:Present     Executive Functions  Concentration:Fair   Attention Span:Fair   Recall:Fair   Fund of Knowledge:Fair   Language:Fair     Psychomotor Activity  Psychomotor Activity:Psychomotor Activity: Decreased   Assets   Assets:Desire for Improvement; Financial Resources/Insurance; Housing; Intimacy; Resilience; Social Support   Sleep  Sleep: No data recorded   Physical Exam: Physical Exam HENT:     Head:  Normocephalic.  Cardiovascular:     Rate and Rhythm: Normal rate.  Pulmonary:     Effort: Pulmonary effort is normal.  Musculoskeletal:        General: Normal range of motion.  Neurological:     General: No focal deficit present.     Mental Status: She is alert.   ROS Blood pressure (!) 151/107, pulse (!) 101, temperature 98 F (36.7 C), temperature source Oral, resp. rate 18, height 5\' 2"  (1.575 m), weight 73.9 kg, SpO2 98 %, not currently breastfeeding. Body mass index is 29.8 kg/m.   COGNITIVE FEATURES THAT CONTRIBUTE TO RISK:  Closed-mindedness and Polarized thinking    SUICIDE RISK:   Severe:  Frequent, intense, and enduring suicidal ideation, specific plan, no subjective intent, but some objective markers of intent (i.e., choice of lethal method), the method is accessible, some limited preparatory behavior, evidence of impaired self-control, severe dysphoria/symptomatology, multiple risk factors present, and few if any protective factors, particularly a lack of social support.  PLAN OF CARE: Daily contact with patient to assess and evaluate symptoms and progress in treatment and Medication management. 55 year old female presenting after suicide attempt via insulin injection.    sertraline 100 mg daily for depression/anxiety. Trazodone for sleep. Vistaril for anxiety.  Suicide precaution Individual/group/Milieu tx as tolerated. Relevant labs reviewed/ordered. TSH-wnl. Bpm ordered Will address medical issues as appropriate    I certify that inpatient services  furnished can reasonably be expected to improve the patient's condition.   Beverly Sessions, MD 06/07/2021, 12:23 PM

## 2021-06-07 NOTE — Progress Notes (Signed)
Patient is A & O x3.  She is medication compliant.  She informed Clinical research associate she did not wish to talk to her husband but she later called and informed spouse of her security code.   Patient did not attend group or go outside with peers.  Patient states she is depressed and extremely tired.    She did eat all her meals.  Remained in room to rest.  Safety checks were completed every 15 minutes for her safety.

## 2021-06-07 NOTE — H&P (Addendum)
Psychiatric Admission Assessment Adult  Patient Identification: Katie [name] MRN:  [id number] Date of Evaluation:  06/07/2021 Chief Complaint:  Severe recurrent major depression without psychotic features (HCC) [F33.2] Principal Diagnosis: <principal problem not specified> Diagnosis:  Active Problems:   Severe recurrent major depression without psychotic features (HCC)  History of Present Illness:  C/C and HPI- A lot going on, I took overdose"  Chart reviewed, pt seen. Patient who made a serious suicide attempt by insulin overdose.  Per psych consult- NOVA [name] is a 55 y.o. female patient admitted with hypoglycemia and hypokalemia secondary to intentional insulin injection, pt now medically cleared . Pt treated by [facility]. Patient seen one-on-one at bedside today. She is speaking at a barely audible whisper, but her thought process is coherent, and she is oriented at time of interview. She does admit to trying to kill herself with insuline, and states "it was stupid." She regrets trying to end her life, and notes that her family is very supportive of her. She wants to make sure it is understood that her family was not the cause of her wanting to end her life. She states her most recent episode of depression stemmed from joining a support group on Facebook. Initially, patient found this to be an enjoyable new form of social support with occasional messages and calls, but it quickly escalated into a time consuming strain. She has people from all over the country messaging her and calling her at all hours of the day and night, and she was unable to sleep. She became increasingly stressed out, but noted she was unable to successfully set limits with this group out of guilt. She was sleeping roughly 2-3 hours due to phone calls, but felt tremendously tired with low energy and hopelessness. Eventually, she felt the only way to escape this group was to end  her life, and she took  some of her mother's old insulin that was in the house. She regrets this attempt now. She has been seen by [facility] for a number of years, and felt her Zoloft and Prazosin were helpful for her. She gives me permission to speak to her husband.   Contacted husband, Alfredo Bach, [phone]. Husband confirms that she was on a social media group, and people were indeed calling her at all hours of the day including 2-3AM. He knew she was stressed from this and told her to get off social media or block the group members. He thought she had improved, and initially when he got home from work she was acting normally and they got in the hot tub. Then she became altered and required EMS. It was not until she arrived at the hospital that she finally admitted to taking insulin.    Pt seen in her room, pt lying in bed, Pt speaks very soft and slow, makes poor eey contact, severe psychomotor retardation. Pt admits overdosing on meds/insulin, reports being stressed out , being " aggravated" by " group of people" around the world, taking over her account and not able to use her debit card, appears suspicious .  Reports sleep problems , feeling exhausted and tired, poor concentration, endorses hopelessness, helplessness.   Associated Signs/Symptoms: Total Time spent with patient: 50 min  Past Psychiatric History: [employer] in [place]. Prescribed temazepam 15 mg QHS, Ambien 10 mg QHS, prazosin 2 mg QHS, and sertraline 100 mg daily. Has seen therapist in the past, but not currently. No other previous suicide attempts, no hospital stays. No  hx of ECT per pt  Alcohol Screening: 1. How often do you have a drink containing alcohol?: Never 2. How many drinks containing alcohol do you have on a typical day when you are drinking?: 1 or 2 3. How often do you have six or more drinks on one occasion?: Never AUDIT-C Score: 0 4. How often during the last year have you found that you were not able to stop drinking once you  had started?: Never 5. How often during the last year have you failed to do what was normally expected from you because of drinking?: Never 6. How often during the last year have you needed a first drink in the morning to get yourself going after a heavy drinking session?: Never 7. How often during the last year have you had a feeling of guilt of remorse after drinking?: Never 8. How often during the last year have you been unable to remember what happened the night before because you had been drinking?: Never 9. Have you or someone else been injured as a result of your drinking?: No 10. Has a relative or friend or a doctor or another health worker been concerned about your drinking or suggested you cut down?: No Alcohol Use Disorder Identification Test Final Score (AUDIT): 0 Substance Abuse History in the last 12 months:  see above Consequences of Substance Abuse:  Previous Psychotropic Medications: yes Psychological Evaluations:  Past Medical History:  Past Medical History:  Diagnosis Date   Anxiety    Cardiomyopathy (HCC)    CHF (congestive heart failure) (HCC)    Hypertension    Renal insufficiency    Substance abuse (HCC)    alcohol   History reviewed. No pertinent surgical history. Family History: History reviewed. No pertinent family history. Family Psychiatric  History: denies Tobacco Screening:   Social History:  Social History   Substance and Sexual Activity  Alcohol Use No     Social History   Substance and Sexual Activity  Drug Use Not Currently    Additional Social History: Marital status: Married Number of Years Married: 10 What types of issues is patient dealing with in the relationship?: Patient denies Are you sexually active?: Yes What is your sexual orientation?: Heterosexual Has your sexual activity been affected by drugs, alcohol, medication, or emotional stress?: Patient states she was up all the time (unable to sleep). Does patient have children?:  Yes How many children?: 3 How is patient's relationship with their children?: "There's a son I don't really talk to but I have a relationship with my other son and my stepdaughter"                         Allergies:   Allergies  Allergen Reactions   Wasp Venom Protein Shortness Of Breath   Ciprofloxacin Rash and Other (See Comments)    Other Reaction: HIVES AND BLISTERS   Lab Results:  Results for orders placed or performed during the hospital encounter of 06/06/21 (from the past 48 hour(s))  Glucose, capillary     Status: Abnormal   Collection Time: 06/06/21 11:40 PM  Result Value Ref Range   Glucose-Capillary 109 (H) 70 - 99 mg/dL    Comment: Glucose reference range applies only to samples taken after fasting for at least 8 hours.  Glucose, capillary     Status: Abnormal   Collection Time: 06/07/21  6:37 AM  Result Value Ref Range   Glucose-Capillary 104 (H) 70 - 99 mg/dL  Comment: Glucose reference range applies only to samples taken after fasting for at least 8 hours.   Comment 1 Notify RN   Glucose, capillary     Status: Abnormal   Collection Time: 06/07/21 11:20 AM  Result Value Ref Range   Glucose-Capillary 117 (H) 70 - 99 mg/dL    Comment: Glucose reference range applies only to samples taken after fasting for at least 8 hours.   Comment 1 Notify RN     Blood Alcohol level:  Lab Results  Component Value Date   ETH <10 06/01/2021    Metabolic Disorder Labs:  No results found for: HGBA1C, MPG No results found for: PROLACTIN No results found for: CHOL, TRIG, HDL, CHOLHDL, VLDL, LDLCALC  Current Medications: Current Facility-Administered Medications  Medication Dose Route Frequency Provider Last Rate Last Admin   acetaminophen (TYLENOL) tablet 650 mg  650 mg Oral Q4H PRN Clapacs, John T, MD       alum & mag hydroxide-simeth (MAALOX/MYLANTA) 200-200-20 MG/5ML suspension 30 mL  30 mL Oral Q4H PRN Clapacs, John T, MD       amLODipine (NORVASC) tablet 5 mg   5 mg Oral Daily Clapacs, John T, MD   5 mg at 06/07/21 0646   carvedilol (COREG) tablet 12.5 mg  12.5 mg Oral BID WC Clapacs, John T, MD   12.5 mg at 06/07/21 7425   docusate sodium (COLACE) capsule 100 mg  100 mg Oral BID PRN Clapacs, Jackquline Denmark, MD       furosemide (LASIX) tablet 40 mg  40 mg Oral Daily Clapacs, Jackquline Denmark, MD   40 mg at 06/07/21 9563   hydrOXYzine (ATARAX/VISTARIL) tablet 50 mg  50 mg Oral TID PRN Clapacs, Jackquline Denmark, MD   50 mg at 06/06/21 2321   magnesium hydroxide (MILK OF MAGNESIA) suspension 30 mL  30 mL Oral Daily PRN Clapacs, John T, MD       MEDLINE mouth rinse  15 mL Mouth Rinse q12n4p Clapacs, John T, MD       mupirocin ointment (BACTROBAN) 2 % 1 application  1 application Nasal BID Clapacs, Jackquline Denmark, MD   1 application at 06/07/21 0834   pantoprazole (PROTONIX) EC tablet 40 mg  40 mg Oral QHS Clapacs, John T, MD       polyethylene glycol (MIRALAX / GLYCOLAX) packet 17 g  17 g Oral Daily PRN Clapacs, John T, MD       sertraline (ZOLOFT) tablet 100 mg  100 mg Oral Daily Clapacs, John T, MD   100 mg at 06/07/21 8756   spironolactone (ALDACTONE) tablet 25 mg  25 mg Oral Daily Clapacs, John T, MD   25 mg at 06/07/21 4332   traZODone (DESYREL) tablet 100 mg  100 mg Oral QHS PRN Clapacs, Jackquline Denmark, MD   100 mg at 06/06/21 2321   PTA Medications: Medications Prior to Admission  Medication Sig Dispense Refill Last Dose   albuterol (PROVENTIL HFA;VENTOLIN HFA) 108 (90 Base) MCG/ACT inhaler Inhale 2 puffs into the lungs every 6 (six) hours as needed for wheezing or shortness of breath.      amLODipine (NORVASC) 5 MG tablet Take 5 mg by mouth daily.      carvedilol (COREG) 12.5 MG tablet Take 12.5 mg by mouth 2 (two) times daily with a meal.      Cholecalciferol 1000 units tablet Take 1,000 Units by mouth daily.      furosemide (LASIX) 40 MG tablet Take 1 tablet (40 mg  total) by mouth daily. 30 tablet     mupirocin ointment (BACTROBAN) 2 % Place 1 application into the nose 2 (two) times  daily. 22 g 0    omeprazole (PRILOSEC) 20 MG capsule Take 20 mg by mouth daily.      sertraline (ZOLOFT) 100 MG tablet Take 100 mg by mouth daily.      spironolactone (ALDACTONE) 25 MG tablet Take 25 mg by mouth daily.       Musculoskeletal: Strength & Muscle Tone: within normal limits Gait & Station: lying in bed Patient leans: N/A           Psychiatric Specialty Exam:   Presentation  General Appearance: Disheveled   Eye Contact poor   Speech: slow, soft   Speech Volume:Decreased   Handedness:Right     Mood and Affect  Mood:Hopeless; Depressed; Anxious, suspicious   Affect: restricted, depressed     Thought Process  Thought Processes: circumstantial   Descriptions of Associations:Intact   Orientation:Full (Time, Place and Person)   Thought Content:Rumination, helpless, suspicious   History of Schizophrenia/Schizoaffective disorder:No data recorded Duration of Psychotic Symptoms:No data recorded Hallucinations:Hallucinations: None   Ideas of Reference:None   Suicidal Thoughts:Suicidal Thoughts: Yes, Passive   Homicidal Thoughts:Homicidal Thoughts: No     Sensorium  Memory:Immediate Fair; Recent Fair; Remote Fair   Judgment:Intact   Insight:Present     Executive Functions  Concentration:Fair   Attention Span:Fair   Recall:Fair   Fund of Knowledge:Fair   Language:Fair     Psychomotor Activity  Psychomotor Activity:Psychomotor Activity: Decreased  Assets  Assets:Desire for Improvement; Financial Resources/Insurance; Housing; Intimacy; Resilience; Social Support   Sleep  Sleep: No data recorded   Physical Exam: Physical Exam ROS Blood pressure (!) 151/107, pulse (!) 101, temperature 98 F (36.7 C), temperature source Oral, resp. rate 18, height 5\' 2"  (1.575 m), weight 73.9 kg, SpO2 98 %, not currently breastfeeding. Body mass index is 29.8 kg/m.  Treatment Plan Summary: Daily contact with patient to assess and evaluate  symptoms and progress in treatment and Medication management. 55 year old female presenting after suicide attempt via insulin injection. Pt has h/o PTSD, pt anxious, suspicious to staff treating her badly on the medical floor, repeatedly states " I don't know what's going on", , "weird " feeling and "swelling " like feeling of right face, no evidence of CVA, CT head on admission- no acute abnormality. pt reports she was tried Seroquel in the past but did not help.   sertraline 100 mg daily for depression/anxiety. Trazodone for sleep. Vistaril for anxiety.  Suicide precaution Individual/group/Milieu tx as tolerated. Relevant labs reviewed/ordered. TSH-wnl. Bpm ordered Will address medical issues as appropriate.  Zyprexa for mood     .    Observation Level/Precautions:    Laboratory:  see above  Psychotherapy:    Medications:    Consultations:    Discharge Concerns:    Estimated LOS:  Other:     Physician Treatment Plan for Primary Diagnosis: <principal problem not specified> Long Term Goal(s): Improvement in symptoms so as ready for discharge  Short Term Goals: Ability to identify changes in lifestyle to reduce recurrence of condition will improve, Ability to verbalize feelings will improve, Ability to disclose and discuss suicidal ideas, Ability to demonstrate self-control will improve, Ability to identify and develop effective coping behaviors will improve, Ability to maintain clinical measurements within normal limits will improve, Compliance with prescribed medications will improve, and Ability to identify triggers associated with substance abuse/mental health issues will improve  Physician Treatment Plan for Secondary Diagnosis: Active Problems:   Severe recurrent major depression without psychotic features (HCC)  Long Term Goal(s): Improvement in symptoms so as ready for discharge  Short Term Goals: Ability to identify changes in lifestyle to reduce recurrence of condition  will improve, Ability to verbalize feelings will improve, Ability to disclose and discuss suicidal ideas, Ability to demonstrate self-control will improve, Ability to identify and develop effective coping behaviors will improve, Ability to maintain clinical measurements within normal limits will improve, Compliance with prescribed medications will improve, and Ability to identify triggers associated with substance abuse/mental health issues will improve  I certify that inpatient services furnished can reasonably be expected to improve the patient's condition.    Beverly SessionsJagannath Delicia Berens, MD 9/3/202211:57 AM

## 2021-06-08 LAB — GLUCOSE, CAPILLARY
Glucose-Capillary: 102 mg/dL — ABNORMAL HIGH (ref 70–99)
Glucose-Capillary: 95 mg/dL (ref 70–99)
Glucose-Capillary: 88 mg/dL (ref 70–99)

## 2021-06-08 LAB — BASIC METABOLIC PANEL
Anion gap: 11 (ref 5–15)
BUN: 27 mg/dL — ABNORMAL HIGH (ref 6–20)
CO2: 26 mmol/L (ref 22–32)
Calcium: 9.3 mg/dL (ref 8.9–10.3)
Chloride: 95 mmol/L — ABNORMAL LOW (ref 98–111)
Creatinine, Ser: 0.95 mg/dL (ref 0.44–1.00)
GFR, Estimated: >60 mL/min (ref >60)
Glucose, Bld: 103 mg/dL — ABNORMAL HIGH (ref 70–99)
Potassium: 3 mmol/L — ABNORMAL LOW (ref 3.5–5.1)
Sodium: 132 mmol/L — ABNORMAL LOW (ref 135–145)

## 2021-06-08 MED ORDER — SERTRALINE HCL 25 MG PO TABS
50.0000 mg | ORAL_TABLET | Freq: Every day | ORAL | Status: DC
  Administered 2021-06-09: 50 mg via ORAL
  Filled 2021-06-08: qty 2

## 2021-06-08 MED ORDER — OLANZAPINE 5 MG PO TABS
5.0000 mg | ORAL_TABLET | Freq: Every day | ORAL | Status: DC
  Administered 2021-06-08 – 2021-06-09 (×2): 5 mg via ORAL
  Filled 2021-06-08 (×2): qty 1

## 2021-06-08 MED ORDER — POTASSIUM CHLORIDE CRYS ER 20 MEQ PO TBCR
20.0000 meq | EXTENDED_RELEASE_TABLET | Freq: Two times a day (BID) | ORAL | Status: DC
  Administered 2021-06-08 – 2021-06-09 (×2): 20 meq via ORAL
  Filled 2021-06-08 (×2): qty 1

## 2021-06-08 NOTE — Plan of Care (Signed)
D: Patient resting in bed this am. Interacting with peers, participating in groups and present on milieu off and on this shift. Denies SI/HI/AH/VH.  Denies pain, depression and anxiety. Takes all medication as prescribed.   A: Labs and vital signs monitored closely. Patient received new order for Potassium. BP elevated this evening. To reassess in 1 hour.   R: Patient tolerated medication well. No adverse reactions. No s/s of distress at present. Cont to monitor for safety.  Problem: Education: Goal: Knowledge of General Education information will improve Description: Including pain rating scale, medication(s)/side effects and non-pharmacologic comfort measures Outcome: Progressing   Problem: Education: Goal: Utilization of techniques to improve thought processes will improve Outcome: Progressing Goal: Knowledge of the prescribed therapeutic regimen will improve Outcome: Progressing   Problem: Coping: Goal: Coping ability will improve Outcome: Progressing Goal: Will verbalize feelings Outcome: Progressing   Problem: Role Relationship: Goal: Will demonstrate positive changes in social behaviors and relationships Outcome: Progressing   Problem: Self-Concept: Goal: Will verbalize positive feelings about self Outcome: Progressing Goal: Level of anxiety will decrease Outcome: Progressing

## 2021-06-08 NOTE — Progress Notes (Signed)
[facility] MD Progress Note  06/08/2021 1:34 PM Katie [name]  MRN:  [id number] Subjective: C/C and  Interval progress- Depressed, anxious" Katie [name] is a 55 y.o. female patient admitted with hypoglycemia and hypokalemia secondary to intentional OD on  insulin injection.  Chart reviewed.  Per nursing- Patient did not attend group or go outside with peers.  Patient states she is depressed and extremely tired.    . Assumed care for Katie [name] at about 19:30 06/07/21 She was resting in the day area, tearful about events leading to IVC admission to ED and this unit. Denies any SI/AVH/HI at time of my assessment. She is interested in starting her home psych meds, she will talk to providers in am abt this. She has been med compliant, abg as documented, no signs of hypoglycemia, prn Trazodone given for insomnia, effective on follow up. She is resting in bed at this time, being monitored as ordered.   Pt seen today, very anxious and suspicious about tx on medical floor, states the sitter watching her while taking shower was traumatic, Pt talked about her past trauma, pt also upset with her husband for allowing other people stay in her home and use her bedroom. Endorses anxiety, depression, blames her living situation for OD, pt ruminates about her stressors at home and tx in the hospital.  Principal Problem: <principal problem not specified> Diagnosis: Active Problems:   Severe recurrent major depression without psychotic features (HCC)  Total Time spent with patient: 50 min  Past Psychiatric History: no new info  Past Medical History:  Past Medical History:  Diagnosis Date   Anxiety    Cardiomyopathy (HCC)    CHF (congestive heart failure) (HCC)    Hypertension    Renal insufficiency    Substance abuse (HCC)    alcohol   History reviewed. No pertinent surgical history. Family History: History reviewed. No pertinent family history. Family Psychiatric  History: no new info  Social History:   Social History   Substance and Sexual Activity  Alcohol Use No     Social History   Substance and Sexual Activity  Drug Use Not Currently    Social History   Socioeconomic History   Marital status: Married    Spouse name: Not on file   Number of children: Not on file   Years of education: Not on file   Highest education level: Not on file  Occupational History   Not on file  Tobacco Use   Smoking status: Never   Smokeless tobacco: Never  Vaping Use   Vaping Use: Never used  Substance and Sexual Activity   Alcohol use: No   Drug use: Not Currently   Sexual activity: Yes  Other Topics Concern   Not on file  Social History Narrative   Not on file   Social Determinants of Health   Financial Resource Strain: Not on file  Food Insecurity: Not on file  Transportation Needs: Not on file  Physical Activity: Not on file  Stress: Not on file  Social Connections: Not on file   Additional Social History:                         Sleep: fair  Appetite:  fair  Current Medications: Current Facility-Administered Medications  Medication Dose Route Frequency Provider Last Rate Last Admin   acetaminophen (TYLENOL) tablet 650 mg  650 mg Oral Q4H PRN Clapacs, Jackquline Denmark, MD       alum &  mag hydroxide-simeth (MAALOX/MYLANTA) 200-200-20 MG/5ML suspension 30 mL  30 mL Oral Q4H PRN Clapacs, John T, MD       amLODipine (NORVASC) tablet 5 mg  5 mg Oral Daily Clapacs, John T, MD   5 mg at 06/08/21 0758   carvedilol (COREG) tablet 12.5 mg  12.5 mg Oral BID WC Clapacs, John T, MD   12.5 mg at 06/08/21 0758   docusate sodium (COLACE) capsule 100 mg  100 mg Oral BID PRN Clapacs, Jackquline Denmark, MD       furosemide (LASIX) tablet 40 mg  40 mg Oral Daily Clapacs, John T, MD   40 mg at 06/08/21 0759   hydrOXYzine (ATARAX/VISTARIL) tablet 50 mg  50 mg Oral TID PRN Clapacs, Jackquline Denmark, MD   50 mg at 06/06/21 2321   magnesium hydroxide (MILK OF MAGNESIA) suspension 30 mL  30 mL Oral Daily PRN  Clapacs, John T, MD       MEDLINE mouth rinse  15 mL Mouth Rinse q12n4p Clapacs, John T, MD       OLANZapine (ZYPREXA) tablet 2.5 mg  2.5 mg Oral QHS Beverly Sessions, MD   2.5 mg at 06/07/21 2115   pantoprazole (PROTONIX) EC tablet 40 mg  40 mg Oral QHS Clapacs, John T, MD   40 mg at 06/07/21 2115   polyethylene glycol (MIRALAX / GLYCOLAX) packet 17 g  17 g Oral Daily PRN Clapacs, John T, MD       sertraline (ZOLOFT) tablet 100 mg  100 mg Oral Daily Clapacs, John T, MD   100 mg at 06/08/21 8099   spironolactone (ALDACTONE) tablet 25 mg  25 mg Oral Daily Clapacs, Jackquline Denmark, MD   25 mg at 06/08/21 0758   traZODone (DESYREL) tablet 100 mg  100 mg Oral QHS PRN Clapacs, Jackquline Denmark, MD   100 mg at 06/07/21 2115    Lab Results:  Results for orders placed or performed during the hospital encounter of 06/06/21 (from the past 48 hour(s))  Glucose, capillary     Status: Abnormal   Collection Time: 06/06/21 11:40 PM  Result Value Ref Range   Glucose-Capillary 109 (H) 70 - 99 mg/dL    Comment: Glucose reference range applies only to samples taken after fasting for at least 8 hours.  Glucose, capillary     Status: Abnormal   Collection Time: 06/07/21  6:37 AM  Result Value Ref Range   Glucose-Capillary 104 (H) 70 - 99 mg/dL    Comment: Glucose reference range applies only to samples taken after fasting for at least 8 hours.   Comment 1 Notify RN   Glucose, capillary     Status: Abnormal   Collection Time: 06/07/21 11:20 AM  Result Value Ref Range   Glucose-Capillary 117 (H) 70 - 99 mg/dL    Comment: Glucose reference range applies only to samples taken after fasting for at least 8 hours.   Comment 1 Notify RN   Glucose, capillary     Status: Abnormal   Collection Time: 06/07/21  5:04 PM  Result Value Ref Range   Glucose-Capillary 141 (H) 70 - 99 mg/dL    Comment: Glucose reference range applies only to samples taken after fasting for at least 8 hours.   Comment 1 Notify RN   Glucose, capillary      Status: Abnormal   Collection Time: 06/07/21  9:31 PM  Result Value Ref Range   Glucose-Capillary 156 (H) 70 - 99 mg/dL    Comment:  Glucose reference range applies only to samples taken after fasting for at least 8 hours.  Glucose, capillary     Status: Abnormal   Collection Time: 06/08/21  6:50 AM  Result Value Ref Range   Glucose-Capillary 102 (H) 70 - 99 mg/dL    Comment: Glucose reference range applies only to samples taken after fasting for at least 8 hours.  Basic metabolic panel     Status: Abnormal   Collection Time: 06/08/21  9:22 AM  Result Value Ref Range   Sodium 132 (L) 135 - 145 mmol/L   Potassium 3.0 (L) 3.5 - 5.1 mmol/L   Chloride 95 (L) 98 - 111 mmol/L   CO2 26 22 - 32 mmol/L   Glucose, Bld 103 (H) 70 - 99 mg/dL    Comment: Glucose reference range applies only to samples taken after fasting for at least 8 hours.   BUN 27 (H) 6 - 20 mg/dL   Creatinine, Ser 7.82 0.44 - 1.00 mg/dL   Calcium 9.3 8.9 - 95.6 mg/dL   GFR, Estimated >21 >30 mL/min    Comment: (NOTE) Calculated using the CKD-EPI Creatinine Equation (2021)    Anion gap 11 5 - 15    Comment: Performed at [facility], [address] [place], [place] [address]  Glucose, capillary     Status: None   Collection Time: 06/08/21 11:04 AM  Result Value Ref Range   Glucose-Capillary 95 70 - 99 mg/dL    Comment: Glucose reference range applies only to samples taken after fasting for at least 8 hours.   Comment 1 Notify RN     Blood Alcohol level:  Lab Results  Component Value Date   ETH <10 06/01/2021    Metabolic Disorder Labs: No results found for: HGBA1C, MPG No results found for: PROLACTIN No results found for: CHOL, TRIG, HDL, CHOLHDL, VLDL, LDLCALC  Physical Findings: AIMS:  , ,  ,  ,    CIWA:    COWS:     Musculoskeletal: Strength & Muscle Tone: within normal limits Gait & Station: normal Patient leans: N/A  Psychiatric Specialty Exam:      Presentation  General  Appearance: Disheveled   Eye Contact poor   Speech: slow, soft   Speech Volume:Decreased   Handedness:Right     Mood and Affect  Mood:Hopeless; Depressed; Anxious, suspicious   Affect: restricted, depressed     Thought Process  Thought Processes: circumstantial   Descriptions of Associations:Intact   Orientation:Full (Time, Place and Person)   Thought Content:Rumination, helpless, suspicious   History of Schizophrenia/Schizoaffective disorder:No data recorded Duration of Psychotic Symptoms:No data recorded Hallucinations:Hallucinations: None   Ideas of Reference:None   Suicidal Thoughts:Suicidal Thoughts: Yes, Passive   Homicidal Thoughts:Homicidal Thoughts: No     Sensorium  Memory:Immediate Fair; Recent Fair; Remote Fair   Judgment:Intact   Insight:Present     Executive Functions  Concentration:Fair   Attention Span:Fair   Recall:Fair   Fund of Knowledge:Fair   Language:Fair     Psychomotor Activity:Psychomotor Activity: Decreased  Assets  Assets:Desire for Improvement; Financial Resources/Insurance; Housing; Intimacy; Resilience; Social Support   Sleep  Sleep: No data recorded   Physical Exam: Physical Exam ROS Blood pressure (!) 147/96, pulse (!) 102, temperature 98.4 F (36.9 C), temperature source Oral, resp. rate 17, height 5\' 2"  (1.575 m), weight 73.9 kg, SpO2 98 %, not currently breastfeeding. Body mass index is 29.8 kg/m.   Treatment Plan Summary: Daily contact with patient to assess and evaluate symptoms  and progress in treatment and Medication management. 55 year old female with depression, PTSD, borderline traits, presenting after suicide attempt via insulin injection. Pt depressed, anxious, ruminates.    Decrease zoloft, concern of making hyponatremia worse, will monitor electrolyte,  Will give K supplement for hypokalemia, will rpt BMP, consider hospitalist consult.  sertraline  for depression/anxiety. Trazodone for  sleep. Vistaril for anxiety.  Suicide precaution Individual/group/Milieu tx as tolerated. Relevant labs reviewed/ordered. TSH-wnl. Bpm ordered Will address medical issues as appropriate.  Zyprexa for mood  Beverly Sessions, MD 06/08/2021, 1:34 PM

## 2021-06-08 NOTE — Group Note (Signed)
[facility] LCSW Group Therapy Note   Group Date: 06/08/2021 Start Time: 1300 End Time: 1400   Type of Therapy and Topic: Group Therapy: Avoiding Self-Sabotaging and Enabling Behaviors  Participation Level: Active  Mood: Depressed  Description of Group:  In this group, patients will learn how to identify obstacles, self-sabotaging and enabling behaviors, as well as: what are they, why do we do them and what needs these behaviors meet. Discuss unhealthy relationships and how to have positive healthy boundaries with those that sabotage and enable. Explore aspects of self-sabotage and enabling in yourself and how to limit these self-destructive behaviors in everyday life.   Therapeutic Goals: 1. Patient will identify one obstacle that relates to self-sabotage and enabling behaviors 2. Patient will identify one personal self-sabotaging or enabling behavior they did prior to admission 3. Patient will state a plan to change the above identified behavior 4. Patient will demonstrate ability to communicate their needs through discussion and/or role play.    Summary of Patient Progress:  Patient identified that not taking the time she needs to herself is a self-sabotaging behavior. Patient also identified not getting enough sleep as a behavior that leads her to a crisis. Patient reflected on what led to her current hospitalization, acknowledging that she was not taking care of herself. Patient shared how the support group she was involved in was too much and how in the future, she can listen to her inner voice when something does not feel right. Patient identified how taking her medication regularly is a behavior that prevents her from self-sabotaging.      Therapeutic Modalities:  Cognitive Behavioral Therapy Person-Centered Therapy Motivational Interviewing    Norberto Sorenson, Theresia Majors 06/06/2021 4:55PM
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
## 2021-06-08 NOTE — [facility] Suicide Risk Assessment (Signed)
[facility] INPATIENT:  Family/Significant Other Suicide Prevention Education  Suicide Prevention Education:  Education Completed; Patton Salles, [phone] has been identified by the patient as the family member/significant other with whom the patient will be residing, and identified as the person(s) who will aid the patient in the event of a mental health crisis (suicidal ideations/suicide attempt).  With written consent from the patient, the family member/significant other has been provided the following suicide prevention education, prior to the and/or following the discharge of the patient.  The suicide prevention education provided includes the following: Suicide risk factors Suicide prevention and interventions National Suicide Hotline telephone number [facility] assessment telephone number [facility] Emergency Assistance 911 [facility] and/or Residential Mobile Crisis Unit telephone number  Request made of family/significant other to: Remove weapons (e.g., guns, rifles, knives), all items previously/currently identified as safety concern.   Remove drugs/medications (over-the-counter, prescriptions, illicit drugs), all items previously/currently identified as a safety concern.  The family member/significant other verbalizes understanding of the suicide prevention education information provided.  The family member/significant other agrees to remove the items of safety concern listed above.  Katie [name] states there are no firearms or illicit medications in the home. Katie [name] agreed to place prescription medications of other members of the household in a locked location. Katie [name] confirmed that patient was not sleeping or eating will for 3 days prior to admission due to involvement with a support group that was contacting her 24 hours a day. Katie [name] bought patient a new cell phone and changed patient's phone number to support patient in cutting ties with support  group and eliminating this stressor.  Crisis resources were provided. No additional concerns noted at this time.  Katie [name] Katie [name] 06/08/2021, 9:20 AM

## 2021-06-08 NOTE — Progress Notes (Signed)
Assumed care for Katie [name] at about 19:30 06/07/21 She was resting in the day area, tearful about events leading to IVC admission to ED and this unit. Denies any SI/AVH/HI at time of my assessment. She is interested in starting her home psych meds, she will talk to providers in am abt this. She has been med compliant, abg as documented, no signs of hypoglycemia, prn Trazodone given for insomnia, effective on follow up. She is resting in bed at this time, being monitored as ordered.

## 2021-06-09 LAB — GLUCOSE, CAPILLARY
Glucose-Capillary: 104 mg/dL — ABNORMAL HIGH (ref 70–99)
Glucose-Capillary: 94 mg/dL (ref 70–99)

## 2021-06-09 LAB — BASIC METABOLIC PANEL
Anion gap: 10 (ref 5–15)
BUN: 26 mg/dL — ABNORMAL HIGH (ref 6–20)
CO2: 29 mmol/L (ref 22–32)
Calcium: 9.5 mg/dL (ref 8.9–10.3)
Chloride: 97 mmol/L — ABNORMAL LOW (ref 98–111)
Creatinine, Ser: 0.96 mg/dL (ref 0.44–1.00)
GFR, Estimated: >60 mL/min (ref >60)
Glucose, Bld: 100 mg/dL — ABNORMAL HIGH (ref 70–99)
Potassium: 3 mmol/L — ABNORMAL LOW (ref 3.5–5.1)
Sodium: 136 mmol/L (ref 135–145)

## 2021-06-09 MED ORDER — RAMIPRIL 10 MG PO CAPS
10.0000 mg | ORAL_CAPSULE | Freq: Every day | ORAL | Status: DC
  Administered 2021-06-09 – 2021-06-10 (×2): 10 mg via ORAL
  Filled 2021-06-09 (×2): qty 1

## 2021-06-09 MED ORDER — TEMAZEPAM 15 MG PO CAPS
15.0000 mg | ORAL_CAPSULE | Freq: Every day | ORAL | Status: DC
  Administered 2021-06-09: 15 mg via ORAL
  Filled 2021-06-09: qty 1

## 2021-06-09 MED ORDER — POTASSIUM CHLORIDE CRYS ER 20 MEQ PO TBCR
40.0000 meq | EXTENDED_RELEASE_TABLET | Freq: Two times a day (BID) | ORAL | Status: DC
  Administered 2021-06-09 – 2021-06-10 (×2): 40 meq via ORAL
  Filled 2021-06-09 (×2): qty 2

## 2021-06-09 MED ORDER — FUROSEMIDE 80 MG PO TABS
160.0000 mg | ORAL_TABLET | Freq: Every day | ORAL | Status: DC
  Administered 2021-06-10: 160 mg via ORAL
  Filled 2021-06-09: qty 2

## 2021-06-09 MED ORDER — SERTRALINE HCL 100 MG PO TABS
100.0000 mg | ORAL_TABLET | Freq: Once | ORAL | Status: AC
  Administered 2021-06-09: 100 mg via ORAL
  Filled 2021-06-09: qty 1

## 2021-06-09 MED ORDER — PRAZOSIN HCL 2 MG PO CAPS
2.0000 mg | ORAL_CAPSULE | Freq: Every day | ORAL | Status: DC
  Administered 2021-06-09: 2 mg via ORAL
  Filled 2021-06-09: qty 1

## 2021-06-09 MED ORDER — FLUTICASONE PROPIONATE 50 MCG/ACT NA SUSP
2.0000 | Freq: Every day | NASAL | Status: DC
  Administered 2021-06-09: 2 via NASAL
  Filled 2021-06-09: qty 16

## 2021-06-09 MED ORDER — FUROSEMIDE 80 MG PO TABS
80.0000 mg | ORAL_TABLET | Freq: Once | ORAL | Status: AC
  Administered 2021-06-09: 80 mg via ORAL
  Filled 2021-06-09: qty 1

## 2021-06-09 MED ORDER — ROSUVASTATIN CALCIUM 5 MG PO TABS
5.0000 mg | ORAL_TABLET | Freq: Every day | ORAL | Status: DC
  Administered 2021-06-09 – 2021-06-10 (×2): 5 mg via ORAL
  Filled 2021-06-09 (×2): qty 1

## 2021-06-09 MED ORDER — ZOLPIDEM TARTRATE 5 MG PO TABS
5.0000 mg | ORAL_TABLET | Freq: Every evening | ORAL | Status: DC | PRN
  Administered 2021-06-09: 5 mg via ORAL
  Filled 2021-06-09: qty 1

## 2021-06-09 MED ORDER — SERTRALINE HCL 100 MG PO TABS
200.0000 mg | ORAL_TABLET | Freq: Every day | ORAL | Status: DC
  Administered 2021-06-10: 200 mg via ORAL
  Filled 2021-06-09: qty 2
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
## 2021-06-09 NOTE — Plan of Care (Signed)
  Problem: Education: Goal: Knowledge of General Education information will improve Description: Including pain rating scale, medication(s)/side effects and non-pharmacologic comfort measures Outcome: Progressing   Problem: Education: Goal: Utilization of techniques to improve thought processes will improve Outcome: Progressing Goal: Knowledge of the prescribed therapeutic regimen will improve Outcome: Progressing   Problem: Activity: Goal: Interest or engagement in leisure activities will improve Outcome: Progressing   Problem: Coping: Goal: Coping ability will improve Outcome: Progressing Goal: Will verbalize feelings Outcome: Progressing

## 2021-06-09 NOTE — Progress Notes (Signed)
Patient presents with sad, flat affect. Concerned about medications taken prior to discharge. States it is concerning to her not to be on the meds she had prior. Pt denies any SI, HI, AVH, endorses depression. Pt isolative to self and room, does intend on going to group today once called.  Minimal interaction with staff and peers. Encouragement and support provided. Safety checks maintained. Medications given as prescribed. Pt receptive and remains safe on unit with q 15 min checks.

## 2021-06-09 NOTE — Progress Notes (Signed)
[facility] MD Progress Note  06/09/2021 12:03 PM Katie [name]  MRN:  [id number]  C/C: "Going to need some therapy."  Subjective: Katie [name] is a 55 y.o. female patient admitted with hypoglycemia and hypokalemia secondary to intentional OD on insulin injection. No acute events overnight, medication compliant, attending to ADLs. Patient seen during treatment team and again one-on-one at bedside. Patient notes that her stressors were the online group discussed while she was on the medical floor. She also has added stressor of her brother's ex-girlfriend and son staying with her in her bedroom and disrupting sleep. Between lack of sleep and stopping her medications she developed severe depression. She notes that she typically sees her psychiatrist at [facility] in [place] and does well on Zoloft 200 mg daily, Temazepam, and Ambien. Confirmed scripts on PMP aware. She notes her cardiac medications were also not resumed. Notes from [facility] and medication reconciliation reviewed, and home medications resumed. Her goal is to stay on her medications, follow-up with psychiatrist, and find a therapist. She notes she has already deleted her social media accounts to eliminate this stressor. Her husband has also found an alternate room for girl and her son to sleep to help assist patient with sleep upon return home. Number for [facility] patient relations also provided to patient upon request.   Principal Problem: Severe recurrent major depression without psychotic features (HCC) Diagnosis: Principal Problem:   Severe recurrent major depression without psychotic features (HCC) Active Problems:   Suicide attempt (HCC)   Hypokalemia   Chronic diastolic congestive heart failure (HCC)   HTN (hypertension)   Chronic post-traumatic stress disorder (PTSD)  Total Time spent with patient: 30 min  Past Psychiatric History: See H&P  Past Medical History:  Past Medical History:  Diagnosis Date    Anxiety    Cardiomyopathy (HCC)    CHF (congestive heart failure) (HCC)    Hypertension    Renal insufficiency    Substance abuse (HCC)    alcohol   History reviewed. No pertinent surgical history. Family History: History reviewed. No pertinent family history. Family Psychiatric  History: See H&P Social History:  Social History   Substance and Sexual Activity  Alcohol Use No     Social History   Substance and Sexual Activity  Drug Use Not Currently    Social History   Socioeconomic History   Marital status: Married    Spouse name: Not on file   Number of children: Not on file   Years of education: Not on file   Highest education level: Not on file  Occupational History   Not on file  Tobacco Use   Smoking status: Never   Smokeless tobacco: Never  Vaping Use   Vaping Use: Never used  Substance and Sexual Activity   Alcohol use: No   Drug use: Not Currently   Sexual activity: Yes  Other Topics Concern   Not on file  Social History Narrative   Not on file   Social Determinants of Health   Financial Resource Strain: Not on file  Food Insecurity: Not on file  Transportation Needs: Not on file  Physical Activity: Not on file  Stress: Not on file  Social Connections: Not on file   Additional Social History:                         Sleep: fair  Appetite:  fair  Current Medications: Current Facility-Administered Medications  Medication Dose Route  Frequency Provider Last Rate Last Admin   acetaminophen (TYLENOL) tablet 650 mg  650 mg Oral Q4H PRN Clapacs, John T, MD       alum & mag hydroxide-simeth (MAALOX/MYLANTA) 200-200-20 MG/5ML suspension 30 mL  30 mL Oral Q4H PRN Clapacs, John T, MD       amLODipine (NORVASC) tablet 5 mg  5 mg Oral Daily Clapacs, John T, MD   5 mg at 06/09/21 0817   carvedilol (COREG) tablet 12.5 mg  12.5 mg Oral BID WC Clapacs, John T, MD   12.5 mg at 06/09/21 0817   docusate sodium (COLACE) capsule 100 mg  100 mg Oral  BID PRN Clapacs, Jackquline Denmark, MD       fluticasone (FLONASE) 50 MCG/ACT nasal spray 2 spray  2 spray Each Nare Daily Jesse Sans, MD       [START ON 06/10/2021] furosemide (LASIX) tablet 160 mg  160 mg Oral Daily Jesse Sans, MD       furosemide (LASIX) tablet 80 mg  80 mg Oral Once Jesse Sans, MD       hydrOXYzine (ATARAX/VISTARIL) tablet 50 mg  50 mg Oral TID PRN Clapacs, Jackquline Denmark, MD   50 mg at 06/06/21 2321   magnesium hydroxide (MILK OF MAGNESIA) suspension 30 mL  30 mL Oral Daily PRN Clapacs, Jackquline Denmark, MD       MEDLINE mouth rinse  15 mL Mouth Rinse q12n4p Clapacs, John T, MD   15 mL at 06/09/21 1154   OLANZapine (ZYPREXA) tablet 5 mg  5 mg Oral QHS Beverly Sessions, MD   5 mg at 06/08/21 2128   pantoprazole (PROTONIX) EC tablet 40 mg  40 mg Oral QHS Clapacs, John T, MD   40 mg at 06/08/21 2127   polyethylene glycol (MIRALAX / GLYCOLAX) packet 17 g  17 g Oral Daily PRN Clapacs, John T, MD       potassium chloride SA (KLOR-CON) CR tablet 40 mEq  40 mEq Oral BID Les Pou M, MD       ramipril (ALTACE) capsule 10 mg  10 mg Oral Daily Les Pou M, MD       rosuvastatin (CRESTOR) tablet 5 mg  5 mg Oral Daily Jesse Sans, MD       [START ON 06/10/2021] sertraline (ZOLOFT) tablet 200 mg  200 mg Oral Daily Jesse Sans, MD       spironolactone (ALDACTONE) tablet 25 mg  25 mg Oral Daily Clapacs, John T, MD   25 mg at 06/09/21 0817   temazepam (RESTORIL) capsule 15 mg  15 mg Oral QHS Jesse Sans, MD       zolpidem [facility]) tablet 5 mg  5 mg Oral QHS PRN Jesse Sans, MD        Lab Results:  Results for orders placed or performed during the hospital encounter of 06/06/21 (from the past 48 hour(s))  Glucose, capillary     Status: Abnormal   Collection Time: 06/07/21  5:04 PM  Result Value Ref Range   Glucose-Capillary 141 (H) 70 - 99 mg/dL    Comment: Glucose reference range applies only to samples taken after fasting for at least 8 hours.   Comment 1 Notify RN    Glucose, capillary     Status: Abnormal   Collection Time: 06/07/21  9:31 PM  Result Value Ref Range   Glucose-Capillary 156 (H) 70 - 99 mg/dL    Comment: Glucose reference range applies  only to samples taken after fasting for at least 8 hours.  Glucose, capillary     Status: Abnormal   Collection Time: 06/08/21  6:50 AM  Result Value Ref Range   Glucose-Capillary 102 (H) 70 - 99 mg/dL    Comment: Glucose reference range applies only to samples taken after fasting for at least 8 hours.  Basic metabolic panel     Status: Abnormal   Collection Time: 06/08/21  9:22 AM  Result Value Ref Range   Sodium 132 (L) 135 - 145 mmol/L   Potassium 3.0 (L) 3.5 - 5.1 mmol/L   Chloride 95 (L) 98 - 111 mmol/L   CO2 26 22 - 32 mmol/L   Glucose, Bld 103 (H) 70 - 99 mg/dL    Comment: Glucose reference range applies only to samples taken after fasting for at least 8 hours.   BUN 27 (H) 6 - 20 mg/dL   Creatinine, Ser 3.29 0.44 - 1.00 mg/dL   Calcium 9.3 8.9 - 51.8 mg/dL   GFR, Estimated >84 >16 mL/min    Comment: (NOTE) Calculated using the CKD-EPI Creatinine Equation (2021)    Anion gap 11 5 - 15    Comment: Performed at [facility], [address] [place], [place] [address]  Glucose, capillary     Status: None   Collection Time: 06/08/21 11:04 AM  Result Value Ref Range   Glucose-Capillary 95 70 - 99 mg/dL    Comment: Glucose reference range applies only to samples taken after fasting for at least 8 hours.   Comment 1 Notify RN   Glucose, capillary     Status: None   Collection Time: 06/08/21  4:30 PM  Result Value Ref Range   Glucose-Capillary 88 70 - 99 mg/dL    Comment: Glucose reference range applies only to samples taken after fasting for at least 8 hours.   Comment 1 Notify RN   Basic metabolic panel     Status: Abnormal   Collection Time: 06/09/21  6:44 AM  Result Value Ref Range   Sodium 136 135 - 145 mmol/L   Potassium 3.0 (L) 3.5 - 5.1 mmol/L   Chloride 97 (L) 98 - 111  mmol/L   CO2 29 22 - 32 mmol/L   Glucose, Bld 100 (H) 70 - 99 mg/dL    Comment: Glucose reference range applies only to samples taken after fasting for at least 8 hours.   BUN 26 (H) 6 - 20 mg/dL   Creatinine, Ser 1.60 0.44 - 1.00 mg/dL   Calcium 9.5 8.9 - 10.9 mg/dL   GFR, Estimated >32 >35 mL/min    Comment: (NOTE) Calculated using the CKD-EPI Creatinine Equation (2021)    Anion gap 10 5 - 15    Comment: Performed at [facility], [address] [place], [place] [address]  Glucose, capillary     Status: Abnormal   Collection Time: 06/09/21 11:26 AM  Result Value Ref Range   Glucose-Capillary 104 (H) 70 - 99 mg/dL    Comment: Glucose reference range applies only to samples taken after fasting for at least 8 hours.   Comment 1 Notify RN     Blood Alcohol level:  Lab Results  Component Value Date   ETH <10 06/01/2021    Metabolic Disorder Labs: No results found for: HGBA1C, MPG No results found for: PROLACTIN No results found for: CHOL, TRIG, HDL, CHOLHDL, VLDL, LDLCALC  Physical Findings: AIMS:  , ,  ,  ,  CIWA:    COWS:     Musculoskeletal: Strength & Muscle Tone: within normal limits Gait & Station: normal Patient leans: N/A  Psychiatric Specialty Exam:      Presentation  General Appearance: Casual   Eye Contact: good   Speech: Normal   Speech Volume:Normal   Handedness:Right     Mood and Affect  Mood:Anxious   Affect: Congruent     Thought Process  Thought Processes: circumstantial   Descriptions of Associations:Intact   Orientation:Full (Time, Place and Person)   Thought Content:Rumination    History of Schizophrenia/Schizoaffective disorder:No  Duration of Psychotic Symptoms:N/A Hallucinations:Hallucinations: None   Ideas of Reference:None   Suicidal Thoughts:Suicidal Thoughts: No   Homicidal Thoughts:Homicidal Thoughts: No     Sensorium  Memory:Immediate Fair; Recent Fair; Remote Fair   Judgment:Intact    Insight:Present     Executive Functions  Concentration:Fair   Attention Span:Fair   Recall:Fair   Fund of Knowledge:Fair   Language:Fair     Psychomotor Activity:Psychomotor Activity: Normal  Assets  Assets:Desire for Improvement; Financial Resources/Insurance; Housing; Intimacy; Resilience; Social Support   Sleep  Sleep: Poor, 5 hours   Physical Exam: Physical Exam ROS Blood pressure (!) 161/118, pulse (!) 110, temperature 97.6 F (36.4 C), temperature source Oral, resp. rate 17, height 5\' 2"  (1.575 m), weight 73.9 kg, SpO2 100 %, not currently breastfeeding. Body mass index is 29.8 kg/m.   Treatment Plan Summary: Daily contact with patient to assess and evaluate symptoms and progress in treatment and Medication management. 55 year old female with depression, PTSD, borderline traits, presenting after suicide attempt via insulin injection.   MDD, recurrent, severe with psychosis - Zoloft 200 mg daily, Zyprexa 5 mg  PTSD - Zoloft as above, restart prazosin 2 mg QHS for nightmares  Insomnia - Restart Temazepam 15 mg QHS, ambien PRN. Medications confirmed on PMP aware  Congestive Heart Failure/HTN - Continue  amlodipine 5 mg, ramipril 10 mg, spironolactone 25 mg, carvedilol 12.5 BID, Increase lasix 160 mg daily. Increase potassium 40 mEq BID  HLD - Resume Crestor 5 mg daily   Allergic Rhinitis - Resume flonase 2 sprays per nostril daily   52, MD 06/09/2021, 12:03 PM

## 2021-06-09 NOTE — BH IP Treatment Plan (Signed)
Interdisciplinary Treatment and Diagnostic Plan Update  06/09/2021 Time of Session: 9:00AM Katie [name] MRN: [id number]  Principal Diagnosis: Severe recurrent major depression without psychotic features [name])  Secondary Diagnoses: Principal Problem:   Severe recurrent major depression without psychotic features (Katie [name]) Active Problems:   Suicide attempt ([name])   Hypokalemia   Chronic diastolic congestive heart failure (HCC)   HTN (hypertension)   Chronic post-traumatic stress disorder (PTSD)   Current Medications:  Current Facility-Administered Medications  Medication Dose Route Frequency Provider Last Rate Last Admin   acetaminophen (TYLENOL) tablet 650 mg  650 mg Oral Q4H PRN Clapacs, John T, MD       alum & mag hydroxide-simeth (MAALOX/MYLANTA) 200-200-20 MG/5ML suspension 30 mL  30 mL Oral Q4H PRN Clapacs, John T, MD       amLODipine (NORVASC) tablet 5 mg  5 mg Oral Daily Clapacs, John T, MD   5 mg at 06/09/21 0817   carvedilol (COREG) tablet 12.5 mg  12.5 mg Oral BID WC Clapacs, John T, MD   12.5 mg at 06/09/21 0817   docusate sodium (COLACE) capsule 100 mg  100 mg Oral BID PRN Clapacs, Madie Reno, MD       fluticasone (FLONASE) 50 MCG/ACT nasal spray 2 spray  2 spray Each Nare Daily Salley Scarlet, MD   2 spray at 06/09/21 1253   [START ON 06/10/2021] furosemide (LASIX) tablet 160 mg  160 mg Oral Daily Salley Scarlet, MD       hydrOXYzine (ATARAX/VISTARIL) tablet 50 mg  50 mg Oral TID PRN Clapacs, Madie Reno, MD   50 mg at 06/06/21 2321   magnesium hydroxide (MILK OF MAGNESIA) suspension 30 mL  30 mL Oral Daily PRN Clapacs, Madie Reno, MD       MEDLINE mouth rinse  15 mL Mouth Rinse q12n4p Clapacs, John T, MD   15 mL at 06/09/21 1154   OLANZapine (ZYPREXA) tablet 5 mg  5 mg Oral QHS Lenward Chancellor, MD   5 mg at 06/08/21 2128   pantoprazole (PROTONIX) EC tablet 40 mg  40 mg Oral QHS Clapacs, John T, MD   40 mg at 06/08/21 2127   polyethylene glycol (MIRALAX / GLYCOLAX) packet 17 g  17  g Oral Daily PRN Clapacs, John T, MD       potassium chloride SA (KLOR-CON) CR tablet 40 mEq  40 mEq Oral BID Salley Scarlet, MD       prazosin (MINIPRESS) capsule 2 mg  2 mg Oral QHS Selina Cooley M, MD       ramipril (ALTACE) capsule 10 mg  10 mg Oral Daily Selina Cooley M, MD   10 mg at 06/09/21 1215   rosuvastatin (CRESTOR) tablet 5 mg  5 mg Oral Daily Salley Scarlet, MD   5 mg at 06/09/21 1215   [START ON 06/10/2021] sertraline (ZOLOFT) tablet 200 mg  200 mg Oral Daily Salley Scarlet, MD       spironolactone (ALDACTONE) tablet 25 mg  25 mg Oral Daily Clapacs, John T, MD   25 mg at 06/09/21 0817   temazepam (RESTORIL) capsule 15 mg  15 mg Oral QHS Salley Scarlet, MD       zolpidem [facility]) tablet 5 mg  5 mg Oral QHS PRN Salley Scarlet, MD       PTA Medications: Medications Prior to Admission  Medication Sig Dispense Refill Last Dose   albuterol (PROVENTIL HFA;VENTOLIN HFA) 108 (90 Base) MCG/ACT inhaler Inhale  2 puffs into the lungs every 6 (six) hours as needed for wheezing or shortness of breath.      amLODipine (NORVASC) 5 MG tablet Take 5 mg by mouth daily.      carvedilol (COREG) 12.5 MG tablet Take 12.5 mg by mouth 2 (two) times daily with a meal.      Cholecalciferol 1000 units tablet Take 1,000 Units by mouth daily.      furosemide (LASIX) 40 MG tablet Take 1 tablet (40 mg total) by mouth daily. 30 tablet     mupirocin ointment (BACTROBAN) 2 % Place 1 application into the nose 2 (two) times daily. 22 g 0    omeprazole (PRILOSEC) 20 MG capsule Take 20 mg by mouth daily.      sertraline (ZOLOFT) 100 MG tablet Take 100 mg by mouth daily.      spironolactone (ALDACTONE) 25 MG tablet Take 25 mg by mouth daily.       Patient Stressors: Health problems   Medication change or noncompliance    Patient Strengths: Motivation for treatment/growth  Supportive family/friends   Treatment Modalities: Medication Management, Group therapy, Case management,  1 to 1 session with  clinician, Psychoeducation, Recreational therapy.   Physician Treatment Plan for Primary Diagnosis: Severe recurrent major depression without psychotic features ([name]) Long Term Goal(s): Improvement in symptoms so as ready for discharge   Short Term Goals: Ability to identify changes in lifestyle to reduce recurrence of condition will improve Ability to verbalize feelings will improve Ability to disclose and discuss suicidal ideas Ability to demonstrate self-control will improve Ability to identify and develop effective coping behaviors will improve Ability to maintain clinical measurements within normal limits will improve Compliance with prescribed medications will improve Ability to identify triggers associated with substance abuse/mental health issues will improve  Medication Management: Evaluate patient's response, side effects, and tolerance of medication regimen.  Therapeutic Interventions: 1 to 1 sessions, Unit Group sessions and Medication administration.  Evaluation of Outcomes: Not Met  Physician Treatment Plan for Secondary Diagnosis: Principal Problem:   Severe recurrent major depression without psychotic features ([name]) Active Problems:   Suicide attempt ([name])   Hypokalemia   Chronic diastolic congestive heart failure (HCC)   HTN (hypertension)   Chronic post-traumatic stress disorder (PTSD)  Long Term Goal(s): Improvement in symptoms so as ready for discharge   Short Term Goals: Ability to identify changes in lifestyle to reduce recurrence of condition will improve Ability to verbalize feelings will improve Ability to disclose and discuss suicidal ideas Ability to demonstrate self-control will improve Ability to identify and develop effective coping behaviors will improve Ability to maintain clinical measurements within normal limits will improve Compliance with prescribed medications will improve Ability to identify triggers associated with substance abuse/mental  health issues will improve     Medication Management: Evaluate patient's response, side effects, and tolerance of medication regimen.  Therapeutic Interventions: 1 to 1 sessions, Unit Group sessions and Medication administration.  Evaluation of Outcomes: Not Met   RN Treatment Plan for Primary Diagnosis: Severe recurrent major depression without psychotic features ([place]) Long Term Goal(s): Knowledge of disease and therapeutic regimen to maintain health will improve  Short Term Goals: Ability to remain free from injury will improve, Ability to verbalize frustration and anger appropriately will improve, Ability to demonstrate self-control, Ability to participate in decision making will improve, Ability to verbalize feelings will improve, Ability to disclose and discuss suicidal ideas, Ability to identify and develop effective coping behaviors will improve, and Compliance  with prescribed medications will improve  Medication Management: RN will administer medications as ordered by provider, will assess and evaluate patient's response and provide education to patient for prescribed medication. RN will report any adverse and/or side effects to prescribing provider.  Therapeutic Interventions: 1 on 1 counseling sessions, Psychoeducation, Medication administration, Evaluate responses to treatment, Monitor vital signs and CBGs as ordered, Perform/monitor CIWA, COWS, AIMS and Fall Risk screenings as ordered, Perform wound care treatments as ordered.  Evaluation of Outcomes: Not Met   LCSW Treatment Plan for Primary Diagnosis: Severe recurrent major depression without psychotic features ([place]) Long Term Goal(s): Safe transition to appropriate next level of care at discharge, Engage patient in therapeutic group addressing interpersonal concerns.  Short Term Goals: Engage patient in aftercare planning with referrals and resources, Increase social support, Increase ability to appropriately verbalize  feelings, Increase emotional regulation, Facilitate acceptance of mental health diagnosis and concerns, Identify triggers associated with mental health/substance abuse issues, and Increase skills for wellness and recovery  Therapeutic Interventions: Assess for all discharge needs, 1 to 1 time with Social worker, Explore available resources and support systems, Assess for adequacy in community support network, Educate family and significant other(s) on suicide prevention, Complete Psychosocial Assessment, Interpersonal group therapy.  Evaluation of Outcomes: Not Met   Progress in Treatment: Attending groups: Yes. Participating in groups: Yes. Taking medication as prescribed: Yes. Toleration medication: Yes. Family/Significant other contact made: Yes, individual(s) contacted:  pt's partner Patient understands diagnosis: Yes. Discussing patient identified problems/goals with staff: Yes. Medical problems stabilized or resolved: Yes. Denies suicidal/homicidal ideation: No. Issues/concerns per patient self-inventory: No. Other: None  New problem(s) identified: No, Describe:  None  New Short Term/Long Term Goal(s): medication management for mood stabilization; elimination of SI thoughts; development of comprehensive mental wellness/sobriety plan.   Patient Goals:  "I don't know why I did what I need, couldn't get rest." "Get more sleep and find a therapist"  Discharge Plan or Barriers: CSW will assist pt with development of appropriate discharge/aftercare plan  Reason for Continuation of Hospitalization: Depression Medication stabilization Suicidal ideation  Estimated Length of Stay: 1-7 days   Scribe for Treatment Team: Leeanne Butters A [place], Latanya Presser 06/09/2021 3:03 PM

## 2021-06-10 LAB — BASIC METABOLIC PANEL
Anion gap: 12 (ref 5–15)
BUN: 43 mg/dL — ABNORMAL HIGH (ref 6–20)
CO2: 25 mmol/L (ref 22–32)
Calcium: 9.4 mg/dL (ref 8.9–10.3)
Chloride: 97 mmol/L — ABNORMAL LOW (ref 98–111)
Creatinine, Ser: 2.83 mg/dL — ABNORMAL HIGH (ref 0.44–1.00)
GFR, Estimated: 19 mL/min — ABNORMAL LOW (ref >60)
Glucose, Bld: 97 mg/dL (ref 70–99)
Potassium: 3.1 mmol/L — ABNORMAL LOW (ref 3.5–5.1)
Sodium: 134 mmol/L — ABNORMAL LOW (ref 135–145)

## 2021-06-10 LAB — GLUCOSE, CAPILLARY: Glucose-Capillary: 126 mg/dL — ABNORMAL HIGH (ref 70–99)

## 2021-06-10 MED ORDER — PRAZOSIN HCL 2 MG PO CAPS: 2.0000 mg | ORAL_CAPSULE | Freq: Every day | ORAL | 1 refills | Status: AC

## 2021-06-10 MED ORDER — ROSUVASTATIN CALCIUM 5 MG PO TABS: 5.0000 mg | ORAL_TABLET | Freq: Every day | ORAL | 1 refills | Status: AC

## 2021-06-10 MED ORDER — FUROSEMIDE 80 MG PO TABS: 80.0000 mg | ORAL_TABLET | Freq: Every day | ORAL | Status: DC

## 2021-06-10 MED ORDER — TEMAZEPAM 15 MG PO CAPS: 15.0000 mg | ORAL_CAPSULE | Freq: Every day | ORAL | 1 refills | Status: AC

## 2021-06-10 MED ORDER — SERTRALINE HCL 100 MG PO TABS: 200.0000 mg | ORAL_TABLET | Freq: Every day | ORAL | 1 refills | Status: AC

## 2021-06-10 MED ORDER — RAMIPRIL 10 MG PO CAPS: 10.0000 mg | ORAL_CAPSULE | Freq: Every day | ORAL | 1 refills | Status: AC

## 2021-06-10 MED ORDER — POTASSIUM CHLORIDE CRYS ER 20 MEQ PO TBCR: 40.0000 meq | EXTENDED_RELEASE_TABLET | Freq: Two times a day (BID) | ORAL | 1 refills | Status: AC

## 2021-06-10 MED ORDER — OLANZAPINE 5 MG PO TABS: 5.0000 mg | ORAL_TABLET | Freq: Every day | ORAL | 1 refills | Status: AC

## 2021-06-10 NOTE — Progress Notes (Signed)
Patient is alert and oriented x 4 affect is flat, he forwards very little, she appears anxious and sad she  isolates to herself in her room, currently denies SI/HI/AVH, Patient was offered emotional support and given scheduled medication regimen. 15 minutes safety checks maintained will continue to monitor.

## 2021-06-10 NOTE — Care Management Important Message (Signed)
Important Message  Patient Details  Name: Katie [name] MRN: [id number] Date of Birth: 11/16/1965   Medicare Important Message Given:  Yes     Drako Maese A [place], LCSWA 06/10/2021, 11:06 AM

## 2021-06-10 NOTE — Discharge Summary (Signed)
Physician Discharge Summary Note  Patient:  Katie [name] is an 55 y.o., female MRN:  [id number] DOB:  [birth year] Patient phone:  [phone] (home)  Patient address:   [address] [place] [address],  Total Time spent with patient: 35 minutes- 25 minutes face-to-face contact with patient, 10 minutes documentation, coordination of care, scripts   Date of Admission:  06/06/2021 Date of Discharge: 06/10/2021  Reason for Admission:  Suicidal attempt via insulin injection  Principal Problem: Severe recurrent major depression without psychotic features [facility]) Discharge Diagnoses: Principal Problem:   Severe recurrent major depression without psychotic features (HCC) Active Problems:   Suicide attempt (HCC)   Hypokalemia   Chronic diastolic congestive heart failure (HCC)   HTN (hypertension)   Chronic post-traumatic stress disorder (PTSD)   Past Psychiatric History: [employer] in [place]. Prescribed temazepam 15 mg QHS, Ambien 10 mg QHS, prazosin 2 mg QHS, and sertraline 100 mg daily. Has seen therapist in the past, but not currently. No other previous suicide attempts, no hospital stays. No hx of ECT per pt  Past Medical History:  Past Medical History:  Diagnosis Date   Anxiety    Cardiomyopathy (HCC)    CHF (congestive heart failure) (HCC)    Hypertension    Renal insufficiency    Substance abuse (HCC)    alcohol   History reviewed. No pertinent surgical history. Family History: History reviewed. No pertinent family history. Family Psychiatric  History: Denies Social History:  Social History   Substance and Sexual Activity  Alcohol Use No     Social History   Substance and Sexual Activity  Drug Use Not Currently    Social History   Socioeconomic History   Marital status: Married    Spouse name: Not on file   Number of children: Not on file   Years of education: Not on file   Highest education level: Not on file  Occupational History    Not on file  Tobacco Use   Smoking status: Never   Smokeless tobacco: Never  Vaping Use   Vaping Use: Never used  Substance and Sexual Activity   Alcohol use: No   Drug use: Not Currently   Sexual activity: Yes  Other Topics Concern   Not on file  Social History Narrative   Not on file   Social Determinants of Health   Financial Resource Strain: Not on file  Food Insecurity: Not on file  Transportation Needs: Not on file  Physical Activity: Not on file  Stress: Not on file  Social Connections: Not on file    Hospital Course:  Katie [name] is a 55 y.o. female patient admitted with hypoglycemia and hypokalemia secondary to intentional insulin injection, pt now medically cleared . Pt treated by [facility]. While here she was restarted on Zoloft and titrated back to home dose of 200 mg daily. She was also restarted on Temazepam with good effect for insomnia. Mood improved and she denies suicidal ideations, homicidal ideations, visual hallucinations, and auditory hallucinations. Home medications for heart failure and HTN restarted, lasix decreased to 40 mg daily due to AKI from increased dose. Her potassium was also increased to 40 mEQ twice daily for hypokalemia.   Physical Findings: AIMS: Facial and Oral Movements Muscles of Facial Expression: None, normal Lips and Perioral Area: None, normal Jaw: None, normal Tongue: None, normal,Extremity Movements Upper (arms, wrists, hands, fingers): None, normal Lower (legs, knees, ankles, toes): None, normal, Trunk Movements Neck, shoulders, hips: None,  normal, Overall Severity Severity of abnormal movements (highest score from questions above): None, normal Incapacitation due to abnormal movements: None, normal Patient's awareness of abnormal movements (rate only patient's report): No Awareness, Dental Status Current problems with teeth and/or dentures?: No Does patient usually wear dentures?: No  CIWA:    COWS:      Musculoskeletal: Strength & Muscle Tone: within normal limits Gait & Station: normal Patient leans: N/A   Psychiatric Specialty Exam:  Presentation  General Appearance: Casual; Appropriate for Environment  Eye Contact:Good  Speech:Clear and Coherent; Normal Rate  Speech Volume:Normal  Handedness:Right   Mood and Affect  Mood:Euthymic  Affect:Congruent   Thought Process  Thought Processes:Coherent; Goal Directed  Descriptions of Associations:Intact  Orientation:Full (Time, Place and Person)  Thought Content:Logical  History of Schizophrenia/Schizoaffective disorder:No data recorded Duration of Psychotic Symptoms:No data recorded Hallucinations:Hallucinations: None  Ideas of Reference:None  Suicidal Thoughts:Suicidal Thoughts: No  Homicidal Thoughts:Homicidal Thoughts: No   Sensorium  Memory:Immediate Good; Recent Good; Remote Good  Judgment:Intact  Insight:Present   Executive Functions  Concentration:Fair  Attention Span:Fair  Recall:Fair  Fund of Knowledge:Fair  Language:Fair   Psychomotor Activity  Psychomotor Activity:Psychomotor Activity: Normal   Assets  Assets:Communication Skills; Desire for Improvement; Financial Resources/Insurance; Housing; Intimacy; Physical Health; Resilience; Social Support; Talents/Skills   Sleep  Sleep:Sleep: Good    Physical Exam: Physical Exam Vitals and nursing note reviewed.  Constitutional:      Appearance: Normal appearance.  HENT:     Head: Normocephalic and atraumatic.     Right Ear: External ear normal.     Left Ear: External ear normal.     Nose: Nose normal.     Mouth/Throat:     Mouth: Mucous membranes are moist.     Pharynx: Oropharynx is clear.  Eyes:     Extraocular Movements: Extraocular movements intact.     Conjunctiva/sclera: Conjunctivae normal.     Pupils: Pupils are equal, round, and reactive to light.  Cardiovascular:     Rate and Rhythm: Normal rate.     Pulses:  Normal pulses.  Pulmonary:     Effort: Pulmonary effort is normal.     Breath sounds: Normal breath sounds.  Abdominal:     General: Abdomen is flat.     Palpations: Abdomen is soft.  Musculoskeletal:        General: No swelling. Normal range of motion.     Cervical back: Normal range of motion and neck supple.  Skin:    General: Skin is warm and dry.  Neurological:     General: No focal deficit present.     Mental Status: She is alert and oriented to person, place, and time.  Psychiatric:        Mood and Affect: Mood normal.        Behavior: Behavior normal.        Thought Content: Thought content normal.        Judgment: Judgment normal.   Review of Systems  Constitutional: Negative.   HENT: Negative.    Eyes: Negative.   Respiratory: Negative.    Cardiovascular: Negative.   Gastrointestinal: Negative.   Genitourinary: Negative.   Musculoskeletal: Negative.   Skin: Negative.   Neurological: Negative.   Endo/Heme/Allergies:  Positive for environmental allergies. Does not bruise/bleed easily.  Psychiatric/Behavioral:  Negative for depression, hallucinations, memory loss, substance abuse and suicidal ideas. The patient is not nervous/anxious and does not have insomnia.   Blood pressure 102/67, pulse 96, temperature (!) 97.5 F (36.4  C), temperature source Oral, resp. rate 18, height 5\' 2"  (1.575 m), weight 73.9 kg, SpO2 93 %, not currently breastfeeding. Body mass index is 29.8 kg/m.   Social History   Tobacco Use  Smoking Status Never  Smokeless Tobacco Never   Tobacco Cessation:  N/A, patient does not currently use tobacco products   Blood Alcohol level:  Lab Results  Component Value Date   ETH <10 06/01/2021    Metabolic Disorder Labs:  No results found for: HGBA1C, MPG No results found for: PROLACTIN No results found for: CHOL, TRIG, HDL, CHOLHDL, VLDL, LDLCALC  See Psychiatric Specialty Exam and Suicide Risk Assessment completed by Attending Physician  prior to discharge.  Discharge destination:  Home  Is patient on multiple antipsychotic therapies at discharge:  No   Has Patient had three or more failed trials of antipsychotic monotherapy by history:  No  Recommended Plan for Multiple Antipsychotic Therapies: NA  Discharge Instructions     Diet - low sodium heart healthy   Complete by: As directed    Increase activity slowly   Complete by: As directed       Allergies as of 06/10/2021       Reactions   Wasp Venom Protein Shortness Of Breath   Ciprofloxacin Rash, Other (See Comments)   Other Reaction: HIVES AND BLISTERS        Medication List     STOP taking these medications    mupirocin ointment 2 % Commonly known as: BACTROBAN       TAKE these medications      Indication  albuterol 108 (90 Base) MCG/ACT inhaler Commonly known as: VENTOLIN HFA Inhale 2 puffs into the lungs every 6 (six) hours as needed for wheezing or shortness of breath.  Indication: Asthma   amLODipine 5 MG tablet Commonly known as: NORVASC Take 5 mg by mouth daily.  Indication: High Blood Pressure Disorder   carvedilol 12.5 MG tablet Commonly known as: COREG Take 12.5 mg by mouth 2 (two) times daily with a meal.  Indication: Cardiac Failure, High Blood Pressure Disorder   Cholecalciferol 25 MCG (1000 UT) tablet Take 1,000 Units by mouth daily.  Indication: Vitamin D Deficiency   furosemide 40 MG tablet Commonly known as: LASIX Take 1 tablet (40 mg total) by mouth daily.  Indication: High Blood Pressure Disorder   OLANZapine 5 MG tablet Commonly known as: ZYPREXA Take 1 tablet (5 mg total) by mouth at bedtime.  Indication: Major Depressive Disorder   omeprazole 20 MG capsule Commonly known as: PRILOSEC Take 20 mg by mouth daily.  Indication: Gastroesophageal Reflux Disease   potassium chloride SA 20 MEQ tablet Commonly known as: KLOR-CON Take 2 tablets (40 mEq total) by mouth 2 (two) times daily.  Indication: High  Blood Pressure Disorder, Low Amount of Potassium in the Blood   prazosin 2 MG capsule Commonly known as: MINIPRESS Take 1 capsule (2 mg total) by mouth at bedtime.  Indication: Frightening Dreams   ramipril 10 MG capsule Commonly known as: ALTACE Take 1 capsule (10 mg total) by mouth daily. Start taking on: June 11, 2021  Indication: High Blood Pressure Disorder   rosuvastatin 5 MG tablet Commonly known as: CRESTOR Take 1 tablet (5 mg total) by mouth daily. Start taking on: June 11, 2021  Indication: High Amount of Fats in the Blood   sertraline 100 MG tablet Commonly known as: ZOLOFT Take 2 tablets (200 mg total) by mouth daily. Start taking on: June 11, 2021 What  changed: how much to take  Indication: Major Depressive Disorder   spironolactone 25 MG tablet Commonly known as: ALDACTONE Take 25 mg by mouth daily.  Indication: High Blood Pressure Disorder   temazepam 15 MG capsule Commonly known as: RESTORIL Take 1 capsule (15 mg total) by mouth at bedtime.  Indication: Trouble Sleeping         Follow-up recommendations:  Activity:  as tolerated Diet:  low sodium heart healthy diet  Comments:  30-day scripts with 1 refill sent to [facility] per patient request. Recommend follow-up with cardiologist in regards to medications for heart failure and HTN. If suicidal ideations recur call 911 or proceed to nearest emergency room.   Signed: Jesse Sans, MD 06/10/2021, 9:22 AM

## 2021-06-10 NOTE — Progress Notes (Signed)
Discharge Note  Patient denies SI/HI/Avh and contracts for safety at discharge.  Writer reviewed AVS with patient and gave patient AVS documents.  Patient educated on prescribed medications and verbalized importance of treatment plan. Patient verbalized understanding of information given.  Patient left unit with all belongings listed on pt belongings list. Patient escorted to lobby where she was met by family who will drive her home safely.

## 2021-06-10 NOTE — Progress Notes (Signed)
Recreation Therapy Notes   Date: 06/10/2021  Time: 9:45 am   Location: Craft room   Behavioral response: Appropriate  Intervention Topic: Problem Solving   Discussion/Intervention:  Group content on today was focused on problem solving. The group described what problem solving is. Patients expressed how problems affect them and how they deal with problems. Individuals identified healthy ways to deal with problems. Patients explained what normally happens to them when they do not deal with problems. The group expressed reoccurring problems for them. The group participated in the intervention "Ways to Solve problems" where patients were given a chance to explore different ways to solve problems.  Clinical Observations/Feedback: Patient came to group and was defined problem solving as reflection on outcomes.She stated that she problem solves by talking to friends that she calls her sisters. Individual was social with peers and staff while participating in the intervention.  Katie [name] LRT/CTRS         Tiernan Suto 06/10/2021 12:06 PM

## 2021-06-10 NOTE — Plan of Care (Signed)
Patient alert and oriented on unit.  Patient presents Flat but Cooperative.  Patient denies SI/HI/AVH and contracts for safety.  Patient rates Depression 3/10 and Anxiety 5/10 verbalized she sleeps better on unit.  Rates pain 7/10 chronic back pain and refused medication to manage pain.  Patient states she will rest.  Patient compliant with treatment plan.  Patient did comply with scheduled meds per MD orders. Patient denies any adverse reaction to medications administered.  Support and encouragement provided.  Safety checks q 15 minutes maintained Problem: Education: Goal: Knowledge of General Education information will improve Description: Including pain rating scale, medication(s)/side effects and non-pharmacologic comfort measures Outcome: Progressing   Problem: Education: Goal: Utilization of techniques to improve thought processes will improve Outcome: Progressing Goal: Knowledge of the prescribed therapeutic regimen will improve Outcome: Progressing   Problem: Activity: Goal: Interest or engagement in leisure activities will improve Outcome: Not Progressing  .

## 2021-06-10 NOTE — [facility] Suicide Risk Assessment (Signed)
[facility] Discharge Suicide Risk Assessment   Principal Problem: Severe recurrent major depression without psychotic features [facility]) Discharge Diagnoses: Principal Problem:   Severe recurrent major depression without psychotic features (HCC) Active Problems:   Suicide attempt (HCC)   Hypokalemia   Chronic diastolic congestive heart failure (HCC)   HTN (hypertension)   Chronic post-traumatic stress disorder (PTSD)   Total Time spent with patient: 35 minutes- 25 minutes face-to-face contact with patient, 10 minutes documentation, coordination of care, scripts   Musculoskeletal: Strength & Muscle Tone: within normal limits Gait & Station: normal Patient leans: N/A  Psychiatric Specialty Exam  Presentation  General Appearance: Casual; Appropriate for Environment  Eye Contact:Good  Speech:Clear and Coherent; Normal Rate  Speech Volume:Normal  Handedness:Right   Mood and Affect  Mood:Euthymic  Duration of Depression Symptoms: No data recorded Affect:Congruent   Thought Process  Thought Processes:Coherent; Goal Directed  Descriptions of Associations:Intact  Orientation:Full (Time, Place and Person)  Thought Content:Logical  History of Schizophrenia/Schizoaffective disorder:No data recorded Duration of Psychotic Symptoms:No data recorded Hallucinations:Hallucinations: None  Ideas of Reference:None  Suicidal Thoughts:Suicidal Thoughts: No  Homicidal Thoughts:Homicidal Thoughts: No   Sensorium  Memory:Immediate Good; Recent Good; Remote Good  Judgment:Intact  Insight:Present   Executive Functions  Concentration:Fair  Attention Span:Fair  Recall:Fair  Fund of Knowledge:Fair  Language:Fair   Psychomotor Activity  Psychomotor Activity:Psychomotor Activity: Normal   Assets  Assets:Communication Skills; Desire for Improvement; Financial Resources/Insurance; Housing; Intimacy; Physical Health; Resilience; Social Support; Talents/Skills   Sleep   Sleep:Sleep: Good   Physical Exam: Physical Exam ROS Blood pressure 102/67, pulse 96, temperature (!) 97.5 F (36.4 C), temperature source Oral, resp. rate 18, height 5\' 2"  (1.575 m), weight 73.9 kg, SpO2 93 %, not currently breastfeeding. Body mass index is 29.8 kg/m.  Mental Status Per Nursing Assessment::   On Admission:  NA  Demographic Factors:  Caucasian  Loss Factors: NA  Historical Factors: Prior suicide attempts  Risk Reduction Factors:   Sense of responsibility to family, Religious beliefs about death, Living with another person, especially a relative, Positive social support, Positive therapeutic relationship, and Positive coping skills or problem solving skills  Continued Clinical Symptoms:  Depression:   Recent sense of peace/wellbeing Previous Psychiatric Diagnoses and Treatments Medical Diagnoses and Treatments/Surgeries  Cognitive Features That Contribute To Risk:  None    Suicide Risk:  Minimal: No identifiable suicidal ideation.  Patients presenting with no risk factors but with morbid ruminations; may be classified as minimal risk based on the severity of the depressive symptoms    Plan Of Care/Follow-up recommendations:  Activity:  as tolerated Diet:  low sodium heart healthy diet  [phone], MD 06/10/2021, 9:17 AM

## 2021-06-10 NOTE — Progress Notes (Signed)
  [facility] Adult Case Management Discharge Plan :  Will you be returning to the same living situation after discharge:  Yes,  pt's home At discharge, do you have transportation home?: Yes,  pt's husband will provide transportation Do you have the ability to pay for your medications: Yes,  pt has Medicare Part A & B  Release of information consent forms completed and in the chart;  Patient's signature needed at discharge.  Patient to Follow up at:  Follow-up Information     [facility] Follow up on 06/19/2021.   Why: You have a psychiatry follow up appointment at 10:40am on Thursday September 15th. Thanks! Contact information: [address] [place] [address] [phone]         [facility]. Call.   Why: Your referral information has been shared. Please call office to schedule your intake appointment. Appointments will be telehealth/virtual. Contact information: Address: [address] [place], [place] [address] Phone: [phone] Fax: [phone]        [facility] Follow up.   Why: Here is an additional resource for therapy in the area. Please reach out as needed for services. Contact information: [address] [place] [address] [phone]                 Next level of care provider has access to [facility] Link:no  Safety Planning and Suicide Prevention discussed: Yes,  completed with pt's husband     Has patient been referred to the Quitline?: Patient refused referral  Patient has been referred for addiction treatment: Pt. refused referral  Ernestine Rohman A [place], LCSWA 06/10/2021, 10:51 AM

## 2021-07-14 LAB — BLOOD GAS, VENOUS
Acid-Base Excess: 3.1 mmol/L — ABNORMAL HIGH (ref 0.0–2.0)
Bicarbonate: 28.5 mmol/L — ABNORMAL HIGH (ref 20.0–28.0)
O2 Saturation: 55.1 %
Patient temperature: 37
pCO2, Ven: 46 mmHg (ref 44.0–60.0)
pH, Ven: 7.4 (ref 7.250–7.430)

## 2022-05-08 IMAGING — CT CT HEAD W/O CM
2 series · 15 of 40 positions shown, 18 images · non-contrast
Comparison: Head CT 03/29/2008.

CLINICAL DATA: 54 year old female is unresponsive. Unexplained
altered mental status.

EXAM:
CT HEAD WITHOUT CONTRAST
TECHNIQUE: Contiguous axial images were obtained from the base of the skull
through the vertex without intravenous contrast.

[Series 4: coronal soft tissue · coronal · 0.32mm/px · 12 of 67 slices shown, 15 images]
[im 5/67  brain]
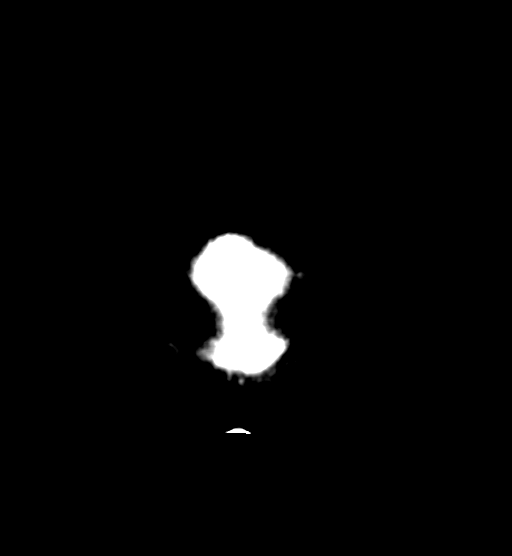
[im 5/67  bone]
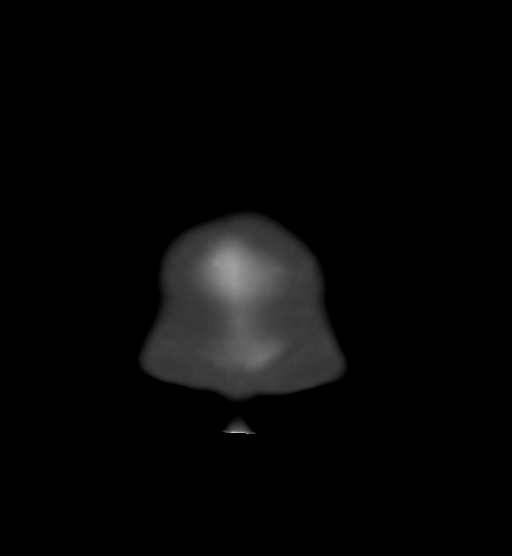
[im 9/67  brain]
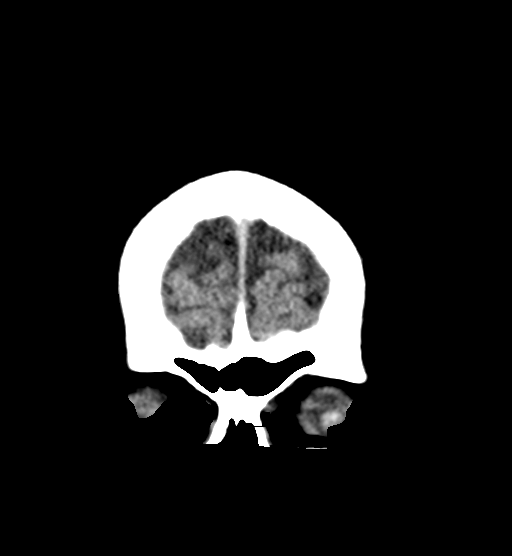
[im 15/67  brain]
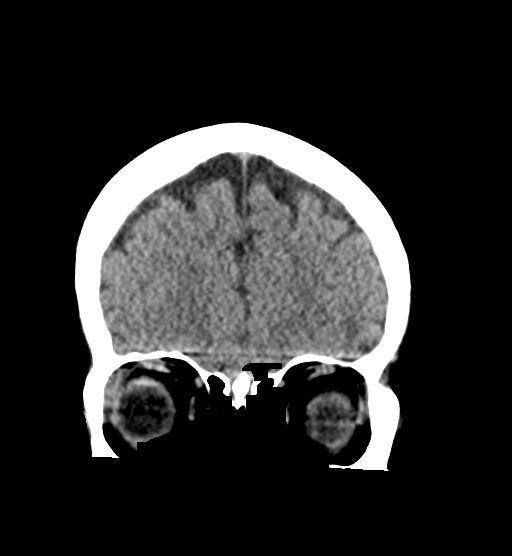
[im 18/67  brain]
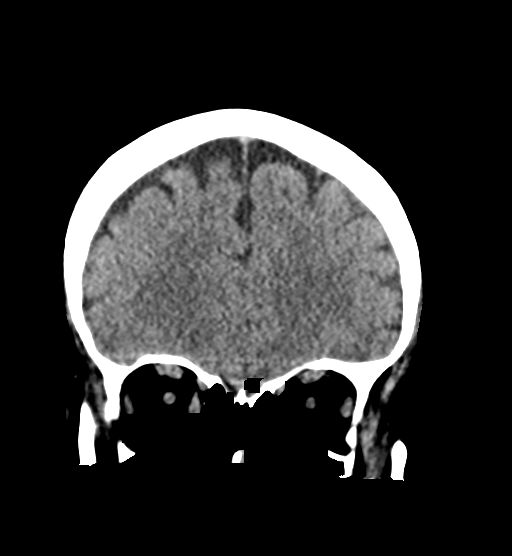
[im 27/67  brain]
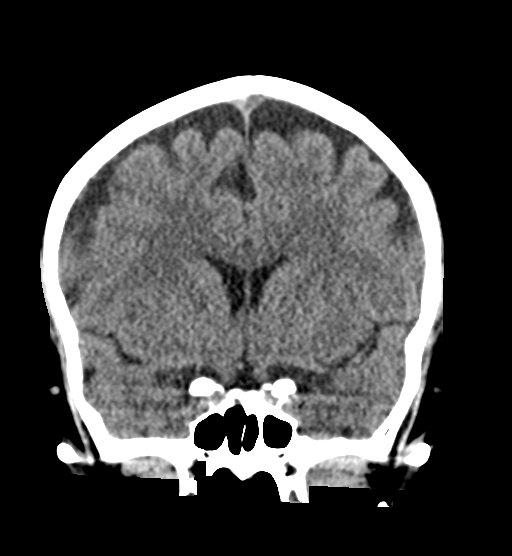
[im 27/67  bone]
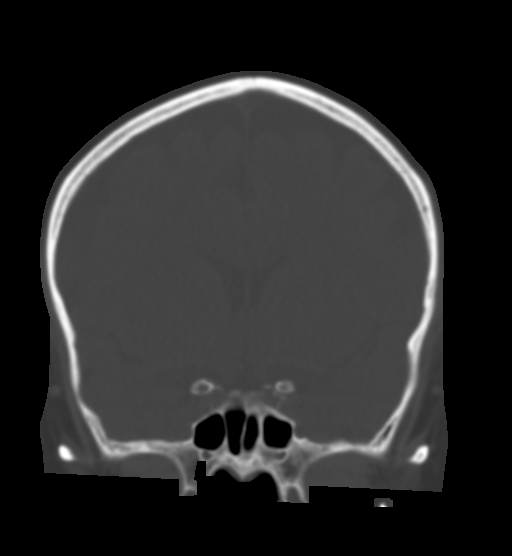
[im 31/67  brain]
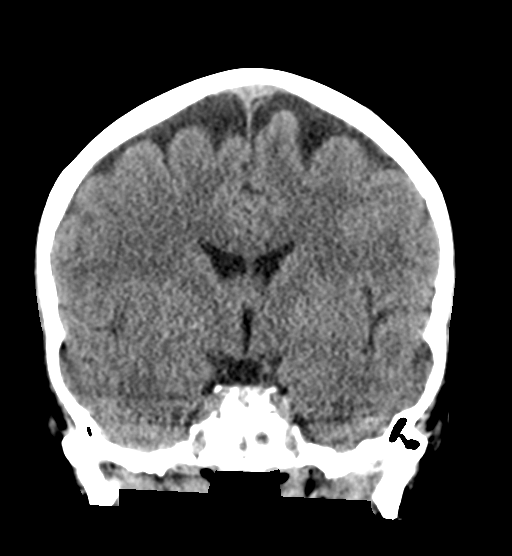
[im 36/67  brain]
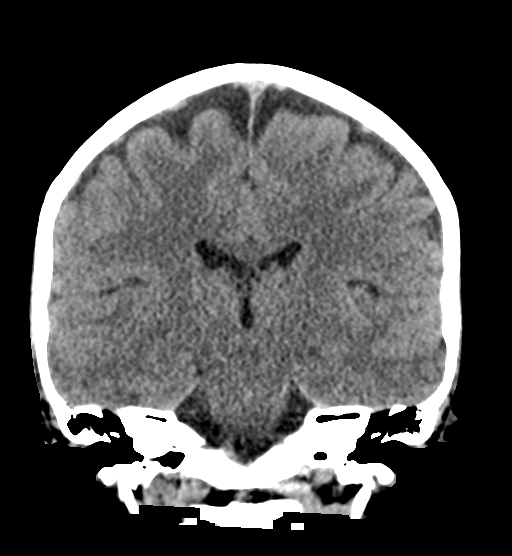
[im 40/67  brain]
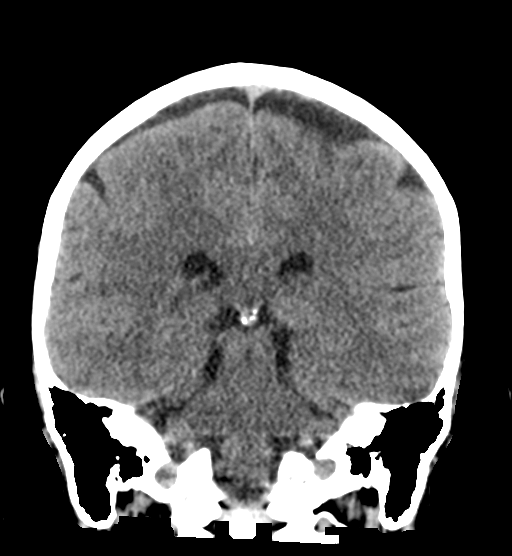
[im 49/67  brain]
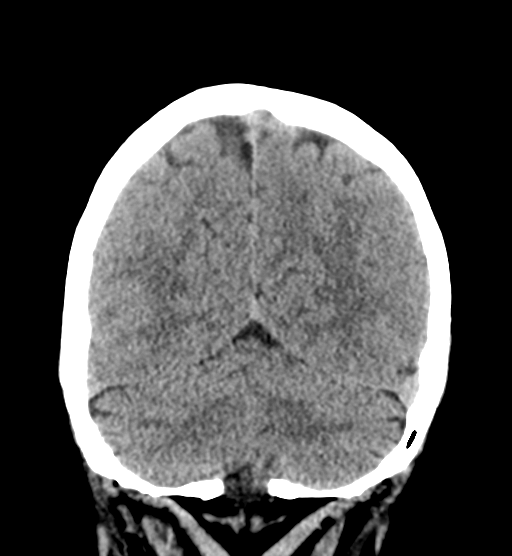
[im 49/67  bone]
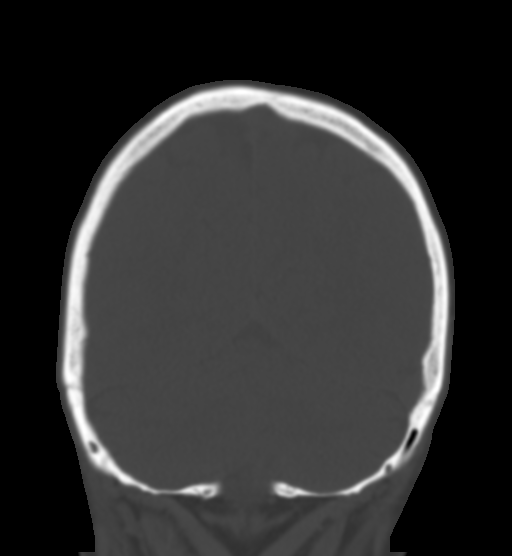
[im 52/67  brain]
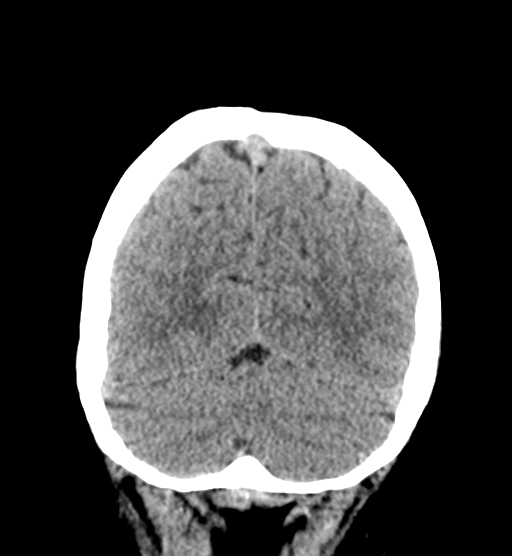
[im 58/67  brain]
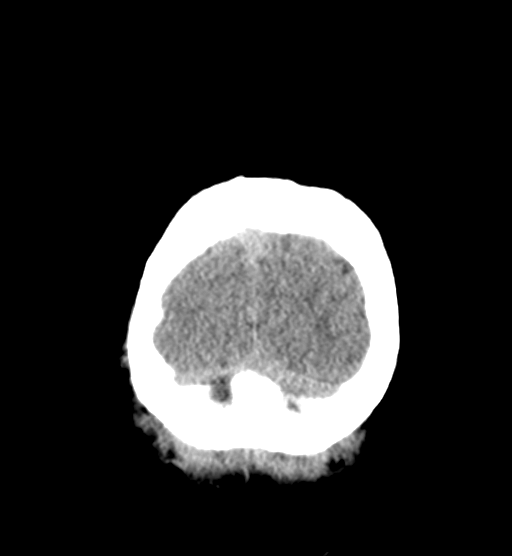
[im 62/67  brain]
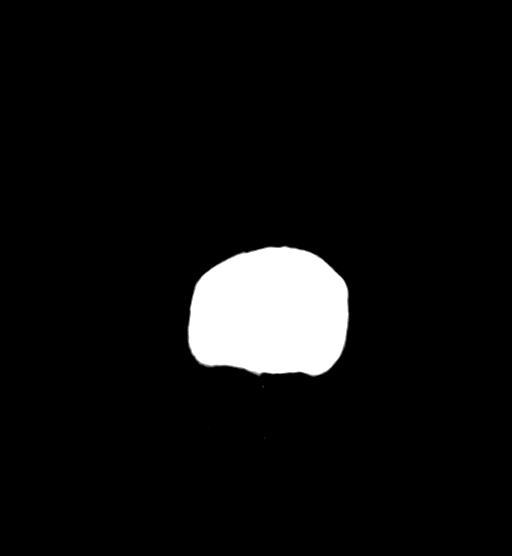

[Series 5: sagittal soft tissue · sagittal · 0.38mm/px · 3 of 55 slices shown]
[im 21/55  brain]
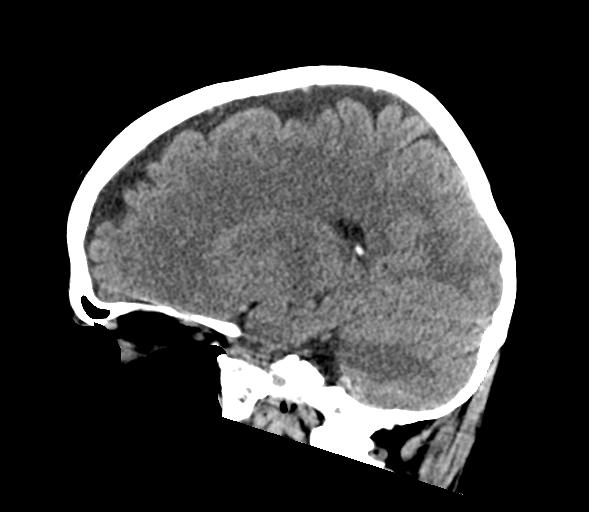
[im 25/55  brain]
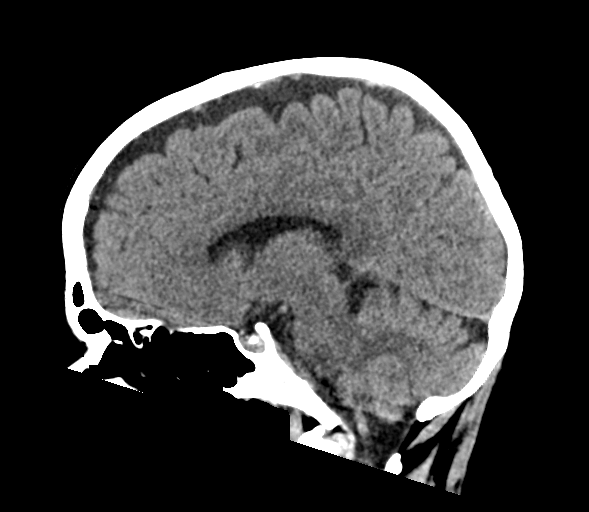
[im 28/55  brain]
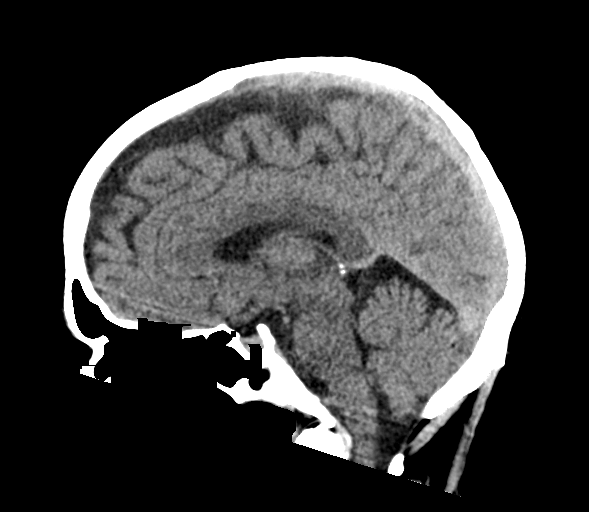

[15 of 40 positions shown; findings below may reference images not displayed]

FINDINGS: Brain: Cerebral volume is stable and within normal limits. No
midline shift, ventriculomegaly, mass effect, evidence of mass
lesion, intracranial hemorrhage or evidence of cortically based
acute infarction. Gray-white matter differentiation is within normal
limits throughout the brain. No edema or encephalomalacia
identified.

Vascular: Calcified atherosclerosis at the skull base. No suspicious
intracranial vascular hyperdensity.

Skull: Intact, negative.

Sinuses/Orbits: Interval left side maxillary antrostomy. Visualized
paranasal sinuses and mastoids are clear.

Other: Visualized orbits and scalp soft tissues are within normal
limits.
IMPRESSION: Normal noncontrast Head CT.

## 2022-06-05 DEATH — deceased
